# Patient Record
Sex: Female | Born: 1950 | Race: Black or African American | Hispanic: No | Marital: Single | Smoking: Current every day smoker
Health system: Southern US, Community
[De-identification: ages and names within clinical notes are randomized; demographics above are authoritative.]

## PROBLEM LIST (undated history)

## (undated) DIAGNOSIS — E7800 Pure hypercholesterolemia, unspecified: Secondary | ICD-10-CM

## (undated) DIAGNOSIS — I10 Essential (primary) hypertension: Secondary | ICD-10-CM

## (undated) HISTORY — PX: HIP SURGERY: SHX245

## (undated) HISTORY — PX: BREAST CYST INCISION AND DRAINAGE: SHX14

## (undated) HISTORY — DX: Pure hypercholesterolemia, unspecified: E78.00

---

## 1999-01-24 ENCOUNTER — Ambulatory Visit (HOSPITAL_COMMUNITY): Admission: RE | Admit: 1999-01-24 | Discharge: 1999-01-24 | Payer: Self-pay | Admitting: *Deleted

## 1999-05-05 ENCOUNTER — Inpatient Hospital Stay (HOSPITAL_COMMUNITY): Admission: EM | Admit: 1999-05-05 | Discharge: 1999-05-15 | Payer: Self-pay | Admitting: Orthopedic Surgery

## 1999-05-06 ENCOUNTER — Encounter: Payer: Self-pay | Admitting: Orthopedic Surgery

## 1999-05-08 ENCOUNTER — Encounter (INDEPENDENT_AMBULATORY_CARE_PROVIDER_SITE_OTHER): Payer: Self-pay | Admitting: Specialist

## 1999-06-23 ENCOUNTER — Encounter: Payer: Self-pay | Admitting: Orthopedic Surgery

## 1999-06-23 ENCOUNTER — Ambulatory Visit (HOSPITAL_COMMUNITY): Admission: RE | Admit: 1999-06-23 | Discharge: 1999-06-23 | Payer: Self-pay | Admitting: Orthopedic Surgery

## 1999-09-05 ENCOUNTER — Encounter: Payer: Self-pay | Admitting: Orthopedic Surgery

## 1999-09-10 ENCOUNTER — Encounter (INDEPENDENT_AMBULATORY_CARE_PROVIDER_SITE_OTHER): Payer: Self-pay | Admitting: Specialist

## 1999-09-10 ENCOUNTER — Inpatient Hospital Stay (HOSPITAL_COMMUNITY): Admission: RE | Admit: 1999-09-10 | Discharge: 1999-09-16 | Payer: Self-pay | Admitting: Orthopedic Surgery

## 1999-09-10 ENCOUNTER — Encounter: Payer: Self-pay | Admitting: Orthopedic Surgery

## 2001-06-27 ENCOUNTER — Encounter: Payer: Self-pay | Admitting: Family Medicine

## 2001-06-27 ENCOUNTER — Ambulatory Visit (HOSPITAL_COMMUNITY): Admission: RE | Admit: 2001-06-27 | Discharge: 2001-06-27 | Payer: Self-pay | Admitting: Family Medicine

## 2001-07-18 ENCOUNTER — Encounter: Payer: Self-pay | Admitting: Family Medicine

## 2001-07-18 ENCOUNTER — Ambulatory Visit (HOSPITAL_COMMUNITY): Admission: RE | Admit: 2001-07-18 | Discharge: 2001-07-18 | Payer: Self-pay | Admitting: Family Medicine

## 2002-08-28 ENCOUNTER — Other Ambulatory Visit: Admission: RE | Admit: 2002-08-28 | Discharge: 2002-08-28 | Payer: Self-pay | Admitting: Obstetrics and Gynecology

## 2003-04-10 ENCOUNTER — Encounter: Payer: Self-pay | Admitting: Orthopedic Surgery

## 2003-04-17 ENCOUNTER — Encounter: Payer: Self-pay | Admitting: Orthopedic Surgery

## 2003-04-17 ENCOUNTER — Inpatient Hospital Stay (HOSPITAL_COMMUNITY): Admission: RE | Admit: 2003-04-17 | Discharge: 2003-04-22 | Payer: Self-pay | Admitting: Orthopedic Surgery

## 2003-06-29 ENCOUNTER — Encounter: Admission: RE | Admit: 2003-06-29 | Discharge: 2003-06-29 | Payer: Self-pay | Admitting: Internal Medicine

## 2003-06-29 ENCOUNTER — Encounter: Payer: Self-pay | Admitting: Internal Medicine

## 2003-07-03 ENCOUNTER — Encounter: Admission: RE | Admit: 2003-07-03 | Discharge: 2003-07-03 | Payer: Self-pay | Admitting: Internal Medicine

## 2003-07-03 ENCOUNTER — Encounter: Payer: Self-pay | Admitting: Internal Medicine

## 2003-09-12 ENCOUNTER — Encounter (INDEPENDENT_AMBULATORY_CARE_PROVIDER_SITE_OTHER): Payer: Self-pay | Admitting: Specialist

## 2003-09-12 ENCOUNTER — Ambulatory Visit (HOSPITAL_COMMUNITY): Admission: RE | Admit: 2003-09-12 | Discharge: 2003-09-12 | Payer: Self-pay | Admitting: Gastroenterology

## 2004-06-30 ENCOUNTER — Other Ambulatory Visit: Admission: RE | Admit: 2004-06-30 | Discharge: 2004-06-30 | Payer: Self-pay | Admitting: Obstetrics and Gynecology

## 2004-07-03 ENCOUNTER — Encounter: Admission: RE | Admit: 2004-07-03 | Discharge: 2004-07-03 | Payer: Self-pay | Admitting: Internal Medicine

## 2005-07-02 ENCOUNTER — Other Ambulatory Visit: Admission: RE | Admit: 2005-07-02 | Discharge: 2005-07-02 | Payer: Self-pay | Admitting: Obstetrics and Gynecology

## 2005-10-07 ENCOUNTER — Encounter: Admission: RE | Admit: 2005-10-07 | Discharge: 2005-10-07 | Payer: Self-pay | Admitting: Obstetrics and Gynecology

## 2006-07-06 ENCOUNTER — Other Ambulatory Visit: Admission: RE | Admit: 2006-07-06 | Discharge: 2006-07-06 | Payer: Self-pay | Admitting: Obstetrics and Gynecology

## 2006-10-12 ENCOUNTER — Encounter: Admission: RE | Admit: 2006-10-12 | Discharge: 2006-10-12 | Payer: Self-pay | Admitting: Internal Medicine

## 2006-10-18 ENCOUNTER — Encounter: Payer: Self-pay | Admitting: Vascular Surgery

## 2006-10-18 ENCOUNTER — Ambulatory Visit (HOSPITAL_COMMUNITY): Admission: RE | Admit: 2006-10-18 | Discharge: 2006-10-18 | Payer: Self-pay | Admitting: Internal Medicine

## 2006-10-19 ENCOUNTER — Encounter: Admission: RE | Admit: 2006-10-19 | Discharge: 2006-10-19 | Payer: Self-pay | Admitting: Internal Medicine

## 2007-12-07 ENCOUNTER — Encounter: Admission: RE | Admit: 2007-12-07 | Discharge: 2007-12-07 | Payer: Self-pay | Admitting: Obstetrics and Gynecology

## 2008-12-07 ENCOUNTER — Encounter: Admission: RE | Admit: 2008-12-07 | Discharge: 2008-12-07 | Payer: Self-pay | Admitting: Obstetrics and Gynecology

## 2010-01-03 ENCOUNTER — Encounter: Admission: RE | Admit: 2010-01-03 | Discharge: 2010-01-03 | Payer: Self-pay | Admitting: Internal Medicine

## 2011-03-27 NOTE — H&P (Signed)
NAME:  Briana Obrien, Briana Obrien NO.:  [id number]   MEDICAL RECORD NO.:  [id number]                    PATIENT TYPE:   LOCATION:                                       FACILITY:   PHYSICIAN:  Georges Lynch. Gioffre, M.D.             DATE OF BIRTH:  [birth year]   DATE OF ADMISSION:  04/17/2003  DATE OF DISCHARGE:                                HISTORY & PHYSICAL   HISTORY OF PRESENT ILLNESS:  The patient has had left hip pain for the last  three years.  The pain has gotten worse over the past year.  She is  ambulating with a cane.  It is known that she has had arthritis in her hip  for approximately four years.  She underwent a right total hip arthroplasty  in 2000 and she has done very well since that surgery and has elected to  proceed with a left total hip arthroplasty.   DRUG ALLERGIES:  PENICILLIN causes a rash and swelling.   CURRENT MEDICATIONS:  None.   PAST MEDICAL HISTORY:  1. Degenerative arthritis.  2. Migraine headaches.  3. Borderline hypertension.  4. Hiatal hernia.  5. Gastroesophageal reflux disease.   FAMILY HISTORY:  Mother had diabetes and a CVA.  Father had heart disease  and diabetes.   REVIEW OF SYMPTOMS:  GENERAL:  She denies weight change, fevers, chills or  fatigue.  HEENT:  She denies headaches, visual changes, hearing loss or sore  throat.  CARDIOVASCULAR:  She denies chest pain, palpitations, shortness of  breath or orthopnea.  PULMONARY:  She denies dyspnea, wheezing, cough,  sputum production or hemoptysis.  GI:  She denies dysphagia, nausea,  vomiting, hematemesis or abdominal pain.  GU:  She denies dysuria,  frequency, urgency or hematuria.  ENDOCRINE:  She denies polyuria,  polydipsia, appetite change, heat or cold intolerance.  MUSCULOSKELETAL:  The patient has pain in her left hip and painful range of motion.  NEUROLOGIC:  She denies dizziness, vertigo, syncope or seizure.  SKIN:  She  denies itching, rashes, masses or  moles.   PHYSICAL EXAMINATION:  VITAL SIGNS:  Temperature 99.0, pulse 78,  respirations 18, blood pressure 130/80 right arm sitting.  GENERAL:  [age]-year-old female in no acute distress.  HEENT:  Pupils equal, round and reactive to light.  Extraocular movements  are intact.  Pharynx is clear.  TM's are intact.  The neck is supple without  masses.  No carotid bruits are detected.  CHEST:  Clear to auscultation bilaterally.  No wheezing, rales or rhonchi  noted.  HEART:  Regular rate and rhythm without murmurs, rubs or gallops.  ABDOMEN:  Positive bowel sounds, soft and non-tender.  No organomegaly or  abnormal masses.  EXTREMITIES:  Left hip - the patient has painful range of motion.  SKIN:  Warm and dry.   X-ray of the left hip shows complete collapse  of the femoral head and a  large crescent sign.    IMPRESSION:  Aseptic necrosis of the left hip.   PLAN:  The patient is to be admitted to [facility] on April 17, 2003  to undergo left total hip arthroplasty.     Ebbie Ridge. Paitsel, P.A.                     Ronald A. Darrelyn Hillock, M.D.    Tilden Dome  D:  04/10/2003  T:  04/10/2003  Job:  [id number]

## 2011-03-27 NOTE — Op Note (Signed)
   NAMESTINA, GANE                             ACCOUNT NO.:  [id number]   MEDICAL RECORD NO.:  [id number]                   PATIENT TYPE:  AMB   LOCATION:  ENDO                                 FACILITY:  [facility]   PHYSICIAN:  Petra Kuba, M.D.                 DATE OF BIRTH:  Nov 01, 1951   DATE OF PROCEDURE:  09/12/2003  DATE OF DISCHARGE:                                 OPERATIVE REPORT   PROCEDURE:  Colonoscopy.   INDICATIONS FOR PROCEDURE:  Multiple GI complaints, family history of colon  polyps, due for colonic screening.   Consent was signed after risks, benefits, methods, and options were  thoroughly discussed in the office.   MEDICINES USED:  Demerol 75, Versed 7.   DESCRIPTION OF PROCEDURE:  Rectal inspection was pertinent for small  external hemorrhoids. Digital exam was negative. The pediatric video  adjustable colonoscope was inserted and with abdominal pressure easily  advanced around the colon to the cecum. No position changes were needed. No  abnormalities were seen on insertion. The scope was inserted a short ways  into the terminal ileum which was normal. The scope was slowly withdrawn.  The prep was adequate. There was some liquid stool that required washing and  suctioning. On slow withdrawal through the colon, no abnormalities were seen  specifically no polyps, tumors, masses or diverticula. Anal rectal pull  through and retroflexion confirmed the hemorrhoids back in the rectum. The  scope was reinserted a short ways into the left side of the colon, air was  suctioned, scope removed. The patient tolerated the procedure well. There  was no obvious or immediate complications.   ENDOSCOPIC DIAGNOSIS:  1. Internal and external hemorrhoids.  2. Otherwise within normal limits to the end of the terminal ileum.   PLAN:  Continue workup with an EGD, rescreen in 5-10 years and yearly  rectals and guaiacs Dr. __________. Please seen endoscopy for further workup  plans  and recommendations.                                               Petra Kuba, M.D.    MEM/MEDQ  D:  09/12/2003  T:  09/12/2003  Job:  [id number]   cc:   Dr. Rozell Searing (?)

## 2011-03-27 NOTE — Op Note (Signed)
   Briana Obrien, Briana Obrien                             ACCOUNT NO.:  [id number]   MEDICAL RECORD NO.:  [id number]                   PATIENT TYPE:  AMB   LOCATION:  ENDO                                 FACILITY:  [facility]   PHYSICIAN:  Petra Kuba, M.D.                 DATE OF BIRTH:  [birth year]   DATE OF PROCEDURE:  09/12/2003  DATE OF DISCHARGE:                                 OPERATIVE REPORT   PROCEDURE:  Esophagogastroduodenoscopy with biopsy.   INDICATIONS FOR PROCEDURE:  Upper tract symptoms long standing. Consent was  signed after risks, benefits, methods, and options were thoroughly discussed  in the office.   MEDICATIONS:  No additional medicines were used for this procedure since it  followed the colonoscopy procedure.   DESCRIPTION OF PROCEDURE:  The video endoscope was inserted by direct  vision. There was a tiny hiatal hernia at the GE junction and very minimal  distal esophagitis but no other abnormalities. The scope passed into the  stomach, advanced through the antrum pertinent for some mild antritis  through a normal pylorus into a normal duodenal bulb and around the C loop  to a normal second portion of the duodenum. The scope was withdrawn back to  the bulb and a good look there ruled out ulcers in that location. The scope  was withdrawn back to the stomach and retroflexed. The angularis was normal  high in the cardia, the hiatal hernia was confirmed; it was tiny.  The  fundus, lesser and greater curve were evaluated on retroflexed visualization  which confirmed some minimal gastritis. Straight visualization of the  stomach confirmed some mild to moderate gastritis throughout but no  additional findings. Biopsies of all areas of the stomach were obtained to  confirm the gastritis, rule out helicobacter. Air was suctioned, the scope  slowly withdrawn. Again a good look at the esophagus confirmed the above  finding. The scope was removed. The patient tolerated the  procedure well.  There was no obvious or immediate complications.   ENDOSCOPIC DIAGNOSES:  1. Tiny hiatal hernia with minimal distal esophagitis.  2. Mild to moderate gastritis, antritis throughout status post biopsy.  3. Otherwise normal esophagogastroduodenoscopy.   PLAN:  Await pathology, continue Prevacid, followup in 2 months to recheck  symptoms and make sure no further workup plans are needed. Happy to see back  sooner p.r.n.                                               Petra Kuba, M.D.    MEM/MEDQ  D:  09/12/2003  T:  09/12/2003  Job:  [id number]   cc:   Dr. ____________________

## 2011-03-27 NOTE — Discharge Summary (Signed)
Briana Obrien, Briana Obrien                             ACCOUNT NO.:  [id number]   MEDICAL RECORD NO.:  [id number]                   PATIENT TYPE:  INP   LOCATION:  0484                                 FACILITY:  [facility]   PHYSICIAN:  Georges Lynch. Darrelyn Hillock, M.D.             DATE OF BIRTH:  Mar 18, 1951   DATE OF ADMISSION:  04/17/2003  DATE OF DISCHARGE:  04/22/2003                                 DISCHARGE SUMMARY   ADMISSION DIAGNOSES:  1. Degenerative arthritis left hip.  2. Migraine headaches.  3. Borderline hypertension.  4. Hiatal hernia.  5. Gastroesophageal reflux disease.   DISCHARGE DIAGNOSES:  1. Degenerative arthritis left hip, status post left total knee     arthroplasty.  2. Migraine headaches.  3. Borderline hypertension.  4. Hiatal hernia.  5. Gastroesophageal reflux disease.   PROCEDURE:  The patient was taken to the operating room on April 17, 2003, to  undergo left total hip arthroplasty.  Surgeon was Windy Fast A. Darrelyn Hillock, M.D.,  assistant was Ronnell Guadalajara, M.D.  The surgery was performed under general  anesthesia.   CONSULTATIONS:  Physical therapy and occupational therapy.   BRIEF HISTORY:  This patient is a 60 year old female who has had left hip  pain for the past three years.  The pain has steadily gotten worse over the  past year.  She is having difficulty ambulating and requires a cane for  support.  The patient has known that she has had arthritis in her hip for  approximately four years.  She has undergone a right total knee arthroplasty  in 2000 and she has done very well since that time and has elected to  proceed with a left total hip arthroplasty and the patient was admitted to  the hospital for the same.   LABORATORY DATA:  Preadmission CBC with wbc 4.4, hemoglobin 12.1, hematocrit  36.7, platelet count 294.  Preadmission PT 11.8, INR 0.8, PTT 27.  Routine  preadmission chemistry was normal.  Preadmission urinalysis was negative.  The patient's blood type  is A negative, negative antibody screen.   Preadmission EKG with normal sinus rhythm at a rate of 65.   Preadmission chest x-ray with no active disease.  Preadmission x-ray of the  left hip revealed advanced avascular necrosis of the left femoral head.  Postoperative x-ray of the left hip revealed presence of hip prosthesis in  good alignment.   HOSPITAL COURSE:  The patient was admitted to [facility] and taken  to the operating room to undergo a left total hip arthroplasty.  The patient  tolerated the procedure well and was allowed to return to the recovery room  and then to the orthopedic floor to continue her postoperative care.  The  patient progressed well.  Her hemoglobin and hematocrits were followed  throughout her hospitalization.  She had a decrease in her hemoglobin on  postoperative day #4 requiring a  blood transfusion.  Her hemoglobin  stabilized prior to discharge.  Hemoglobin at the time of transfusion was  7.9.  Her hemoglobin stabilized at 11.4 following her blood transfusion.  The patient continued to progress.  Physical therapy was consulted for gait  training ambulation when the patient was able to walk greater than 100 feet  by postoperative day #5.  She was discharged home.   DISPOSITION:  The patient was discharged home on April 22, 2003.   DISCHARGE MEDICATIONS:  1. Trinsicon one tablet three times daily.  2. Coumadin 6 mg daily.  3. Percocet 10/650 one or two every six hours as needed for pain.  4. Robaxin 500 mg one every six hours as needed for muscle spasms.   DIET:  As tolerated.   ACTIVITY:  Total hip precautions.  [facility] for home care.  Touchdown  weightbearing to left lower extremity with walker.   FOLLOW UP:  The patient is to follow up with Dr. Darrelyn Hillock two weeks from the  date of surgery.  She should call the office to schedule an appointment.   CONDITION ON DISCHARGE:  Improved.     Ebbie Ridge. Paitsel, P.A.                     Ronald  A. Darrelyn Hillock, M.D.    Tilden Dome  D:  05/07/2003  T:  05/07/2003  Job:  [id number]

## 2011-03-27 NOTE — Op Note (Signed)
Briana Obrien, Briana Obrien                             ACCOUNT NO.:  [id number]   MEDICAL RECORD NO.:  [id number]                   PATIENT TYPE:  INP   LOCATION:  0002                                 FACILITY:  [facility]   PHYSICIAN:  Georges Lynch. Darrelyn Hillock, M.D.             DATE OF BIRTH:  [birth year]   DATE OF PROCEDURE:  04/17/2003  DATE OF DISCHARGE:                                 OPERATIVE REPORT   SURGEON:  Georges Lynch. Darrelyn Hillock, M.D.   ASSISTANT:  Ronnell Guadalajara, M.D.   PREOPERATIVE DIAGNOSIS:  Avascular necrosis, left hip.   POSTOPERATIVE DIAGNOSIS:  Avascular necrosis, left hip.   OPERATION:  Left total hip arthroplasty utilizing the Osteonix system. I  utilized the ceramic prosthesis, the Trident cup, size 48 mm was used with 2  screws for fixation. We used the ceramic insert. The ceramic head was a size  28 mm diameter, -2.5 mm in length. The femoral component was a 5-10 secure  fit femoral component. The ceramic insert was a cup with a 28-mm diameter.   DESCRIPTION OF PROCEDURE:  Under general anesthesia routine orthopedic  prepping and draping of the left hip was carried out. With the patient on  the right side with the left hip up, a posterolateral approach to the hip  was carried out. Bleeders were identified  and cauterized. She had 1 gm of  IV Ancef preoperatively.   Following that the self-retaining retractors were inserted. I then carried  the incision down to the iliotibial band. I excised the greater trochanteric  bursa. I then detached the external rotators with great care taken not to  injure the sciatic nerve. I then did a complete capsulectomy and then  dislocated the head and amputated the head at the appropriate neck length.  Following this I inserted my guide pin and carried out my reaming and  rasping up to a size 10 secure fit. The distal reaming was carried out to  the appropriate diameter for the size 10 prosthesis.   After the femur was prepared we then went on  and reamed the acetabulum up to  a size 46 with a 46 reamer and we elected to use a 48-mm cup. Note we did go  through the appropriate trials prior to inserting the permanent cup. Once we  selected the appropriate position of the cup, we then  inserted our  permanent Trident cup. I utilized the  bur to bur down the medial wall of  the acetabulum in order to get a secure fit for the cup. We then went  through trials once again, and then once the trials were completed, we then  inserted our permanent 28-mm diameter ceramic insert. Prior to doing this we  utilized 2 screws for fixation purposes.   After this we then inserted our permanent size 10 secure fit femoral stem.  We then went through trials as far as our  C-tapered head again and selected  a -2.5 ceramic head 28 mm in diameter. We inserted that over the femoral  stem, reduced the hip and had excellent function and excellent stability.  Note we could not go any longer than a -2.5-mm C-tapered head with a 28-mm  in diameter.   We then irrigated the wound and closed the wound in layers in the usual  fashion. Sterile dressings were applied. The patient was placed in the knee  immobilizer and left the operating room in satisfactory condition.                                               Ronald A. Darrelyn Hillock, M.D.    RAG/MEDQ  D:  04/17/2003  T:  04/17/2003  Job:  [id number]

## 2011-04-27 ENCOUNTER — Other Ambulatory Visit: Payer: Self-pay | Admitting: Internal Medicine

## 2011-04-27 DIAGNOSIS — Z1231 Encounter for screening mammogram for malignant neoplasm of breast: Secondary | ICD-10-CM

## 2011-05-06 ENCOUNTER — Ambulatory Visit
Admission: RE | Admit: 2011-05-06 | Discharge: 2011-05-06 | Disposition: A | Discharge status: Home / Self Care | Payer: Medicaid Other | Source: Ambulatory Visit | Attending: Internal Medicine | Admitting: Internal Medicine

## 2011-05-06 DIAGNOSIS — Z1231 Encounter for screening mammogram for malignant neoplasm of breast: Secondary | ICD-10-CM

## 2013-11-15 ENCOUNTER — Encounter (HOSPITAL_COMMUNITY): Payer: Self-pay | Admitting: Emergency Medicine

## 2013-11-15 ENCOUNTER — Emergency Department (HOSPITAL_COMMUNITY)
Admission: EM | Admit: 2013-11-15 | Discharge: 2013-11-15 | Disposition: A | Discharge status: Home / Self Care | Payer: Medicare Other | Source: Home / Self Care | Attending: Family Medicine | Admitting: Family Medicine

## 2013-11-15 DIAGNOSIS — N390 Urinary tract infection, site not specified: Secondary | ICD-10-CM

## 2013-11-15 DIAGNOSIS — R319 Hematuria, unspecified: Secondary | ICD-10-CM

## 2013-11-15 LAB — POCT URINALYSIS DIP (DEVICE)
Bilirubin Urine: NEGATIVE
GLUCOSE, UA: NEGATIVE mg/dL
Ketones, ur: NEGATIVE mg/dL
NITRITE: NEGATIVE
SPECIFIC GRAVITY, URINE: 1.02 (ref 1.005–1.030)
UROBILINOGEN UA: 0.2 mg/dL (ref 0.0–1.0)
pH: 6.5 (ref 5.0–8.0)

## 2013-11-15 MED ORDER — CIPROFLOXACIN HCL 500 MG PO TABS: 500.0000 mg | ORAL_TABLET | Freq: Two times a day (BID) | ORAL | Status: DC

## 2013-11-15 NOTE — ED Provider Notes (Signed)
Briana Obrien is a 63 y.o. female who presents to Urgent Care today for dysuria urgency frequency and hematuria starting yesterday. No back pain or abdominal pain. No nausea vomiting diarrhea. No fevers or chills. Patient has used cranberry juice which helps some. She feels well otherwise.   History reviewed. No pertinent past medical history. History  Substance Use Topics  . Smoking status: Current Every Day Smoker  . Smokeless tobacco: Not on file  . Alcohol Use: No   ROS as above Medications reviewed. No current facility-administered medications for this encounter.   Current Outpatient Prescriptions  Medication Sig Dispense Refill  . risperiDONE (RISPERDAL) 3 MG tablet Take 3 mg by mouth at bedtime.      . simvastatin (ZOCOR) 20 MG tablet Take 20 mg by mouth daily.      . ciprofloxacin (CIPRO) 500 MG tablet Take 1 tablet (500 mg total) by mouth 2 (two) times daily.  14 tablet  0    Exam:  BP 118/80  Pulse 102  Temp(Src) 98.6 F (37 C) (Oral)  Resp 18  SpO2 96% Gen: Well NAD HEENT:   MMM Lungs: Normal work of breathing. CTABL Heart: RRR no MRG Abd: NABS, Soft. NT, ND no CV angle tenderness to percussion  Exts:  warm and well perfused.   Results for orders placed during the hospital encounter of 11/15/13 (from the past 24 hour(s))  POCT URINALYSIS DIP (DEVICE)     Status: Abnormal   Collection Time    11/15/13  9:30 AM      Result Value Range   Glucose, UA NEGATIVE  NEGATIVE mg/dL   Bilirubin Urine NEGATIVE  NEGATIVE   Ketones, ur NEGATIVE  NEGATIVE mg/dL   Specific Gravity, Urine 1.020  1.005 - 1.030   Hgb urine dipstick LARGE (*) NEGATIVE   pH 6.5  5.0 - 8.0   Protein, ur >=300 (*) NEGATIVE mg/dL   Urobilinogen, UA 0.2  0.0 - 1.0 mg/dL   Nitrite NEGATIVE  NEGATIVE   Leukocytes, UA LARGE (*) NEGATIVE   No results found.  Assessment and Plan: 63 y.o. female with urinary tract infection. Plan to treat with ciprofloxacin as patient is allergic to penicillin. Urine  culture pending. Followup with primary care provider. Recommend AZO for temporary symptom control while antibiotics are working.    Discussed warning signs or symptoms. Please see discharge instructions. Patient expresses understanding.    Gregor Hams, MD 11/15/13 1002

## 2013-11-15 NOTE — ED Notes (Signed)
Blood in UA since yesterday

## 2013-11-15 NOTE — Discharge Instructions (Signed)
Thank you for coming in today. Take Cipro twice daily for one week. Use over-the-counter AZO pills for pain control temporarily. Followup with your primary care doctor. If your belly pain worsens, or you have high fever, bad vomiting, blood in your stool or black tarry stool go to the Emergency Room.

## 2013-11-17 LAB — URINE CULTURE

## 2013-11-17 NOTE — ED Notes (Signed)
Urine culture: 20,000 colonies E. Coli.  Pt. adequately treated with Cipro. Roselyn Meier 11/17/2013

## 2014-04-03 ENCOUNTER — Other Ambulatory Visit: Payer: Self-pay

## 2014-04-03 DIAGNOSIS — Z1231 Encounter for screening mammogram for malignant neoplasm of breast: Secondary | ICD-10-CM

## 2014-04-13 ENCOUNTER — Ambulatory Visit
Admission: RE | Admit: 2014-04-13 | Discharge: 2014-04-13 | Disposition: A | Discharge status: Home / Self Care | Payer: Medicare Other | Source: Ambulatory Visit

## 2014-04-13 ENCOUNTER — Encounter (INDEPENDENT_AMBULATORY_CARE_PROVIDER_SITE_OTHER): Payer: Self-pay

## 2014-04-13 DIAGNOSIS — Z1231 Encounter for screening mammogram for malignant neoplasm of breast: Secondary | ICD-10-CM

## 2014-04-16 ENCOUNTER — Other Ambulatory Visit: Payer: Self-pay | Admitting: Internal Medicine

## 2014-04-16 DIAGNOSIS — R928 Other abnormal and inconclusive findings on diagnostic imaging of breast: Secondary | ICD-10-CM

## 2014-04-20 ENCOUNTER — Ambulatory Visit
Admission: RE | Admit: 2014-04-20 | Discharge: 2014-04-20 | Disposition: A | Discharge status: Home / Self Care | Payer: Medicare Other | Source: Ambulatory Visit | Attending: Internal Medicine | Admitting: Internal Medicine

## 2014-04-20 ENCOUNTER — Other Ambulatory Visit: Payer: Self-pay | Admitting: Internal Medicine

## 2014-04-20 DIAGNOSIS — R928 Other abnormal and inconclusive findings on diagnostic imaging of breast: Secondary | ICD-10-CM

## 2014-05-25 ENCOUNTER — Other Ambulatory Visit: Payer: Self-pay | Admitting: Gastroenterology

## 2015-04-05 ENCOUNTER — Other Ambulatory Visit: Payer: Self-pay

## 2015-04-05 DIAGNOSIS — Z1231 Encounter for screening mammogram for malignant neoplasm of breast: Secondary | ICD-10-CM

## 2015-04-17 ENCOUNTER — Ambulatory Visit
Admission: RE | Admit: 2015-04-17 | Discharge: 2015-04-17 | Disposition: A | Discharge status: Home / Self Care | Payer: Medicare Other | Source: Ambulatory Visit

## 2015-04-17 DIAGNOSIS — Z1231 Encounter for screening mammogram for malignant neoplasm of breast: Secondary | ICD-10-CM

## 2016-04-16 ENCOUNTER — Other Ambulatory Visit (HOSPITAL_COMMUNITY): Payer: Self-pay | Admitting: Diagnostic Radiology

## 2016-04-17 ENCOUNTER — Other Ambulatory Visit: Payer: Self-pay | Admitting: Internal Medicine

## 2016-04-17 DIAGNOSIS — Z1231 Encounter for screening mammogram for malignant neoplasm of breast: Secondary | ICD-10-CM

## 2016-04-18 ENCOUNTER — Other Ambulatory Visit: Payer: Self-pay | Admitting: Internal Medicine

## 2016-04-18 DIAGNOSIS — E2839 Other primary ovarian failure: Secondary | ICD-10-CM

## 2016-05-25 ENCOUNTER — Ambulatory Visit: Payer: Medicare Other

## 2016-05-25 ENCOUNTER — Other Ambulatory Visit: Payer: Medicare Other

## 2016-09-03 ENCOUNTER — Other Ambulatory Visit: Payer: Medicare Other

## 2016-09-03 ENCOUNTER — Ambulatory Visit: Payer: Medicare Other

## 2016-09-15 ENCOUNTER — Ambulatory Visit
Admission: RE | Admit: 2016-09-15 | Discharge: 2016-09-15 | Disposition: A | Discharge status: Home / Self Care | Payer: Medicare Other | Source: Ambulatory Visit | Attending: Internal Medicine | Admitting: Internal Medicine

## 2016-09-15 DIAGNOSIS — E2839 Other primary ovarian failure: Secondary | ICD-10-CM

## 2016-09-15 DIAGNOSIS — Z1231 Encounter for screening mammogram for malignant neoplasm of breast: Secondary | ICD-10-CM

## 2019-05-27 ENCOUNTER — Other Ambulatory Visit: Payer: Self-pay | Admitting: Internal Medicine

## 2019-05-27 DIAGNOSIS — E2839 Other primary ovarian failure: Secondary | ICD-10-CM

## 2019-06-13 ENCOUNTER — Other Ambulatory Visit: Payer: Self-pay | Admitting: Internal Medicine

## 2019-06-13 DIAGNOSIS — Z1231 Encounter for screening mammogram for malignant neoplasm of breast: Secondary | ICD-10-CM

## 2019-08-23 ENCOUNTER — Ambulatory Visit
Admission: RE | Admit: 2019-08-23 | Discharge: 2019-08-23 | Disposition: A | Discharge status: Home / Self Care | Payer: Medicare Other | Source: Ambulatory Visit | Attending: Internal Medicine | Admitting: Internal Medicine

## 2019-08-23 ENCOUNTER — Other Ambulatory Visit: Payer: Self-pay

## 2019-08-23 DIAGNOSIS — Z1231 Encounter for screening mammogram for malignant neoplasm of breast: Secondary | ICD-10-CM

## 2019-08-23 DIAGNOSIS — E2839 Other primary ovarian failure: Secondary | ICD-10-CM

## 2020-01-11 ENCOUNTER — Ambulatory Visit: Payer: Medicare Other | Attending: Internal Medicine

## 2020-01-11 DIAGNOSIS — Z23 Encounter for immunization: Secondary | ICD-10-CM | POA: Insufficient documentation

## 2020-01-11 NOTE — Progress Notes (Signed)
   Covid-19 Vaccination Clinic  Name:  Briana Obrien    MRN: [id number] DOB: 02-20-1951  01/11/2020  Briana Obrien was observed post Covid-19 immunization for 15 minutes without incident. She was provided with Vaccine Information Sheet and instruction to access the V-Safe system.   Briana Obrien was instructed to call 911 with any severe reactions post vaccine: Marland Kitchen Difficulty breathing  . Swelling of face and throat  . A fast heartbeat  . A bad rash all over body  . Dizziness and weakness   Immunizations Administered    Name Date Dose VIS Date Route   Pfizer COVID-19 Vaccine 01/11/2020  3:46 PM 0.3 mL 10/20/2019 Intramuscular   Manufacturer: [place]   Lot: [id number]   [place]: [id number]

## 2020-02-07 ENCOUNTER — Ambulatory Visit: Payer: Medicare Other | Attending: Internal Medicine

## 2020-02-07 DIAGNOSIS — Z23 Encounter for immunization: Secondary | ICD-10-CM

## 2020-02-07 NOTE — Progress Notes (Signed)
   Covid-19 Vaccination Clinic  Name:  [name]    MRN: [id number] DOB: 1951-01-30  02/07/2020  [name] was observed post Covid-19 immunization for 30 minutes based on pre-vaccination screening without incident. She was provided with Vaccine Information Sheet and instruction to access the V-Safe system.   [name] was instructed to call 911 with any severe reactions post vaccine: Marland Kitchen Difficulty breathing  . Swelling of face and throat  . A fast heartbeat  . A bad rash all over body  . Dizziness and weakness   Immunizations Administered    Name Date Dose VIS Date Route   Pfizer COVID-19 Vaccine 02/07/2020 10:18 AM 0.3 mL 10/20/2019 Intramuscular   Manufacturer: [employer], [employer]   Lot: [id number]   [place]: [id number]

## 2020-09-25 ENCOUNTER — Encounter: Payer: Self-pay | Admitting: Obstetrics

## 2020-09-25 ENCOUNTER — Ambulatory Visit (INDEPENDENT_AMBULATORY_CARE_PROVIDER_SITE_OTHER): Payer: Medicare Other | Admitting: Obstetrics

## 2020-09-25 ENCOUNTER — Other Ambulatory Visit: Payer: Self-pay

## 2020-09-25 ENCOUNTER — Other Ambulatory Visit (HOSPITAL_COMMUNITY)
Admission: RE | Admit: 2020-09-25 | Discharge: 2020-09-25 | Disposition: A | Discharge status: Home / Self Care | Payer: Medicare Other | Source: Ambulatory Visit | Attending: Obstetrics | Admitting: Obstetrics

## 2020-09-25 VITALS — BP 144/85 | HR 68 | Ht 62.0 in | Wt 150.0 lb

## 2020-09-25 DIAGNOSIS — N898 Other specified noninflammatory disorders of vagina: Secondary | ICD-10-CM

## 2020-09-25 DIAGNOSIS — Z01419 Encounter for gynecological examination (general) (routine) without abnormal findings: Secondary | ICD-10-CM | POA: Diagnosis not present

## 2020-09-25 DIAGNOSIS — Z1151 Encounter for screening for human papillomavirus (HPV): Secondary | ICD-10-CM | POA: Insufficient documentation

## 2020-09-25 DIAGNOSIS — Z113 Encounter for screening for infections with a predominantly sexual mode of transmission: Secondary | ICD-10-CM

## 2020-09-25 NOTE — Progress Notes (Signed)
Pt presents for annual, pap, and all STD testing.  Last mammogram 08/22/2020 Last Bone Density 08/22/2020 Colonoscopy due in 5 yrs per pt

## 2020-09-25 NOTE — Progress Notes (Signed)
Subjective:        Briana Obrien is a 69 y.o. female here for a routine exam.  Current complaints: Vaginal discharge.    Personal health questionnaire:  Is patient Ashkenazi Jewish, have a family history of breast and/or ovarian cancer: no Is there a family history of uterine cancer diagnosed at age < 42, gastrointestinal cancer, urinary tract cancer, family member who is a Field seismologist syndrome-associated carrier: no Is the patient overweight and hypertensive, family history of diabetes, personal history of gestational diabetes, preeclampsia or PCOS: no Is patient over 60, have PCOS,  family history of premature CHD under age 55, diabetes, smoke, have hypertension or peripheral artery disease:  no At any time, has a partner hit, kicked or otherwise hurt or frightened you?: no Over the past 2 weeks, have you felt down, depressed or hopeless?: no Over the past 2 weeks, have you felt little interest or pleasure in doing things?:no   Gynecologic History No LMP recorded (lmp unknown). Patient is postmenopausal. Contraception: post menopausal status Last Pap: unknown. Results were: normal Last mammogram: 08-22-2020. Results were: normal  Obstetric History OB History  Gravida Para Term Preterm AB Living  4       1 3   SAB TAB Ectopic Multiple Live Births    1     3    # Outcome Date GA Lbr Len/2nd Weight Sex Delivery Anes PTL Lv  4 Gravida           3 Gravida           2 Gravida           1 TAB             Past Medical History:  Diagnosis Date  . Hypercholesteremia     Past Surgical History:  Procedure Laterality Date  . BREAST CYST INCISION AND DRAINAGE    . HIP SURGERY Bilateral      Current Outpatient Medications:  .  risperiDONE (RISPERDAL) 3 MG tablet, Take 3 mg by mouth at bedtime., Disp: , Rfl:  .  simvastatin (ZOCOR) 20 MG tablet, Take 20 mg by mouth daily., Disp: , Rfl:  .  ciprofloxacin (CIPRO) 500 MG tablet, Take 1 tablet (500 mg total) by mouth 2 (two) times daily.  (Patient not taking: Reported on 09/25/2020), Disp: 14 tablet, Rfl: 0 .  Ergocalciferol (VITAMIN D2 PO), Vitamin D2, Disp: , Rfl:  .  simvastatin (ZOCOR) 20 MG tablet, simvastatin 20 mg tablet  Take 1 tablet every day by oral route., Disp: , Rfl:  Allergies  Allergen Reactions  . Latex Itching  . Penicillins Swelling  . Plavix [Clopidogrel] Itching    Social History   Tobacco Use  . Smoking status: Current Every Day Smoker    Packs/day: 4.00    Types: Cigarettes  . Smokeless tobacco: Never Used  Substance Use Topics  . Alcohol use: No    Family History  Problem Relation Age of Onset  . Breast cancer Sister       Review of Systems  Constitutional: negative for fatigue and weight loss Respiratory: negative for cough and wheezing Cardiovascular: negative for chest pain, fatigue and palpitations Gastrointestinal: negative for abdominal pain and change in bowel habits Musculoskeletal:negative for myalgias Neurological: negative for gait problems and tremors Behavioral/Psych: negative for abusive relationship, depression Endocrine: negative for temperature intolerance    Genitourinary:negative for abnormal menstrual periods, genital lesions, hot flashes, sexual problems.  Positive for vaginal discharge Integument/breast: negative for breast lump, breast  tenderness, nipple discharge and skin lesion(s)    Objective:       BP (!) 144/85   Pulse 68   Ht 5\' 2"  (1.575 m)   Wt 150 lb (68 kg)   LMP  (LMP Unknown)   BMI 27.44 kg/m  General:   alert and no distress  Skin:   no rash or abnormalities  Lungs:   clear to auscultation bilaterally  Heart:   regular rate and rhythm, S1, S2 normal, no murmur, click, rub or gallop  Breasts:   normal without suspicious masses, skin or nipple changes or axillary nodes  Abdomen:  normal findings: no organomegaly, soft, non-tender and no hernia  Pelvis:  External genitalia: normal general appearance Urinary system: urethral meatus normal  and bladder without fullness, nontender Vaginal: normal without tenderness, induration or masses Cervix: normal appearance Adnexa: normal bimanual exam Uterus: anteverted and non-tender, normal size   Lab Review Urine pregnancy test Labs reviewed yes Radiologic studies reviewed yes  50% of 20 min visit spent on counseling and coordination of care.   Assessment:     1. Encounter for routine gynecological examination with Papanicolaou smear of cervix Rx: - Cytology - PAP  2. Vaginal discharge Rx: - Cervicovaginal ancillary only( [facility])  3. Screen for STD (sexually transmitted disease) Rx: - Hepatitis B surface antigen - Hepatitis C antibody - HIV Antibody (routine testing w rflx) - RPR    Plan:    Education reviewed: calcium supplements, depression evaluation, low fat, low cholesterol diet, safe sex/STD prevention, self breast exams, smoking cessation and weight bearing exercise. Contraception: post menopausal status. Follow up in: 1 year.    Orders Placed This Encounter  Procedures  . Hepatitis B surface antigen  . Hepatitis C antibody  . HIV Antibody (routine testing w rflx)  . RPR   Need to obtain previous records  Shelly Bombard, MD 09/25/2020 10:20 AM

## 2020-09-25 NOTE — Patient Instructions (Signed)
Health Maintenance for Postmenopausal Women Menopause is a normal process in which your ability to get pregnant comes to an end. This process happens slowly over many months or years, usually between the ages of 48 and 55. Menopause is complete when you have missed your menstrual periods for 12 months. It is important to talk with your health care provider about some of the most common conditions that affect women after menopause (postmenopausal women). These include heart disease, cancer, and bone loss (osteoporosis). Adopting a healthy lifestyle and getting preventive care can help to promote your health and wellness. The actions you take can also lower your chances of developing some of these common conditions. What should I know about menopause? During menopause, you may get a number of symptoms, such as:  Hot flashes. These can be moderate or severe.  Night sweats.  Decrease in sex drive.  Mood swings.  Headaches.  Tiredness.  Irritability.  Memory problems.  Insomnia. Choosing to treat or not to treat these symptoms is a decision that you make with your health care provider. Do I need hormone replacement therapy?  Hormone replacement therapy is effective in treating symptoms that are caused by menopause, such as hot flashes and night sweats.  Hormone replacement carries certain risks, especially as you become older. If you are thinking about using estrogen or estrogen with progestin, discuss the benefits and risks with your health care provider. What is my risk for heart disease and stroke? The risk of heart disease, heart attack, and stroke increases as you age. One of the causes may be a change in the body's hormones during menopause. This can affect how your body uses dietary fats, triglycerides, and cholesterol. Heart attack and stroke are medical emergencies. There are many things that you can do to help prevent heart disease and stroke. Watch your blood pressure  High  blood pressure causes heart disease and increases the risk of stroke. This is more likely to develop in people who have high blood pressure readings, are of African descent, or are overweight.  Have your blood pressure checked: ? Every 3-5 years if you are 18-39 years of age. ? Every year if you are 40 years old or older. Eat a healthy diet   Eat a diet that includes plenty of vegetables, fruits, low-fat dairy products, and lean protein.  Do not eat a lot of foods that are high in solid fats, added sugars, or sodium. Get regular exercise Get regular exercise. This is one of the most important things you can do for your health. Most adults should:  Try to exercise for at least 150 minutes each week. The exercise should increase your heart rate and make you sweat (moderate-intensity exercise).  Try to do strengthening exercises at least twice each week. Do these in addition to the moderate-intensity exercise.  Spend less time sitting. Even light physical activity can be beneficial. Other tips  Work with your health care provider to achieve or maintain a healthy weight.  Do not use any products that contain nicotine or tobacco, such as cigarettes, e-cigarettes, and chewing tobacco. If you need help quitting, ask your health care provider.  Know your numbers. Ask your health care provider to check your cholesterol and your blood sugar (glucose). Continue to have your blood tested as directed by your health care provider. Do I need screening for cancer? Depending on your health history and family history, you may need to have cancer screening at different stages of your life. This   may include screening for:  Breast cancer.  Cervical cancer.  Lung cancer.  Colorectal cancer. What is my risk for osteoporosis? After menopause, you may be at increased risk for osteoporosis. Osteoporosis is a condition in which bone destruction happens more quickly than new bone creation. To help prevent  osteoporosis or the bone fractures that can happen because of osteoporosis, you may take the following actions:  If you are 33-49 years old, get at least 1,000 mg of calcium and at least 600 mg of vitamin D per day.  If you are older than age 39 but younger than age 37, get at least 1,200 mg of calcium and at least 600 mg of vitamin D per day.  If you are older than age 47, get at least 1,200 mg of calcium and at least 800 mg of vitamin D per day. Smoking and drinking excessive alcohol increase the risk of osteoporosis. Eat foods that are rich in calcium and vitamin D, and do weight-bearing exercises several times each week as directed by your health care provider. How does menopause affect my mental health? Depression may occur at any age, but it is more common as you become older. Common symptoms of depression include:  Low or sad mood.  Changes in sleep patterns.  Changes in appetite or eating patterns.  Feeling an overall lack of motivation or enjoyment of activities that you previously enjoyed.  Frequent crying spells. Talk with your health care provider if you think that you are experiencing depression. General instructions See your health care provider for regular wellness exams and vaccines. This may include:  Scheduling regular health, dental, and eye exams.  Getting and maintaining your vaccines. These include: ? Influenza vaccine. Get this vaccine each year before the flu season begins. ? Pneumonia vaccine. ? Shingles vaccine. ? Tetanus, diphtheria, and pertussis (Tdap) booster vaccine. Your health care provider may also recommend other immunizations. Tell your health care provider if you have ever been abused or do not feel safe at home. Summary  Menopause is a normal process in which your ability to get pregnant comes to an end.  This condition causes hot flashes, night sweats, decreased interest in sex, mood swings, headaches, or lack of sleep.  Treatment for this  condition may include hormone replacement therapy.  Take actions to keep yourself healthy, including exercising regularly, eating a healthy diet, watching your weight, and checking your blood pressure and blood sugar levels.  Get screened for cancer and depression. Make sure that you are up to date with all your vaccines. This information is not intended to replace advice given to you by your health care provider. Make sure you discuss any questions you have with your health care provider. Document Revised: 10/19/2018 Document Reviewed: 10/19/2018 Elsevier Patient Education  [place].  Constipation, Adult Constipation is when a person:  Poops (has a bowel movement) fewer times in a week than normal.  Has a hard time pooping.  Has poop that is dry, hard, or bigger than normal. Follow these instructions at home: Eating and drinking   Eat foods that have a lot of fiber, such as: ? Fresh fruits and vegetables. ? Whole grains. ? Beans.  Eat less of foods that are high in fat, low in fiber, or overly processed, such as: ? [place] fries. ? Hamburgers. ? Cookies. ? Candy. ? Soda.  Drink enough fluid to keep your pee (urine) clear or pale yellow. General instructions  Exercise regularly or as told by your  doctor.  Go to the restroom when you feel like you need to poop. Do not hold it in.  Take over-the-counter and prescription medicines only as told by your doctor. These include any fiber supplements.  Do pelvic floor retraining exercises, such as: ? Doing deep breathing while relaxing your lower belly (abdomen). ? Relaxing your pelvic floor while pooping.  Watch your condition for any changes.  Keep all follow-up visits as told by your doctor. This is important. Contact a doctor if:  You have pain that gets worse.  You have a fever.  You have not pooped for 4 days.  You throw up (vomit).  You are not hungry.  You lose weight.  You are bleeding from the  anus.  You have thin, pencil-like poop (stool). Get help right away if:  You have a fever, and your symptoms suddenly get worse.  You leak poop or have blood in your poop.  Your belly feels hard or bigger than normal (is bloated).  You have very bad belly pain.  You feel dizzy or you faint. This information is not intended to replace advice given to you by your health care provider. Make sure you discuss any questions you have with your health care provider. Document Revised: 10/08/2017 Document Reviewed: 04/15/2016 Elsevier Patient Education  2020 [employer].

## 2020-09-26 LAB — HIV ANTIBODY (ROUTINE TESTING W REFLEX): HIV Screen 4th Generation wRfx: NONREACTIVE

## 2020-09-26 LAB — CERVICOVAGINAL ANCILLARY ONLY
Bacterial Vaginitis (gardnerella): NEGATIVE
Candida Glabrata: NEGATIVE
Candida Vaginitis: NEGATIVE
Chlamydia: NEGATIVE
Comment: NEGATIVE
Comment: NEGATIVE
Comment: NEGATIVE
Comment: NEGATIVE
Comment: NEGATIVE
Comment: NORMAL
Neisseria Gonorrhea: NEGATIVE
Trichomonas: NEGATIVE

## 2020-09-26 LAB — HEPATITIS B SURFACE ANTIGEN: Hepatitis B Surface Ag: NEGATIVE

## 2020-09-26 LAB — RPR: RPR Ser Ql: NONREACTIVE

## 2020-09-26 LAB — HEPATITIS C ANTIBODY: Hep C Virus Ab: <0.1 s/co ratio (ref 0.0–0.9)

## 2020-09-27 LAB — CYTOLOGY - PAP
Comment: NEGATIVE
Diagnosis: NEGATIVE
High risk HPV: NEGATIVE

## 2020-10-01 ENCOUNTER — Other Ambulatory Visit: Payer: Self-pay | Admitting: Internal Medicine

## 2020-10-01 DIAGNOSIS — Z1231 Encounter for screening mammogram for malignant neoplasm of breast: Secondary | ICD-10-CM

## 2020-11-15 ENCOUNTER — Other Ambulatory Visit: Payer: Self-pay

## 2020-11-15 ENCOUNTER — Ambulatory Visit
Admission: RE | Admit: 2020-11-15 | Discharge: 2020-11-15 | Disposition: A | Discharge status: Home / Self Care | Payer: Medicare Other | Source: Ambulatory Visit | Attending: Internal Medicine | Admitting: Internal Medicine

## 2020-11-15 DIAGNOSIS — Z1231 Encounter for screening mammogram for malignant neoplasm of breast: Secondary | ICD-10-CM

## 2021-03-17 ENCOUNTER — Other Ambulatory Visit (HOSPITAL_COMMUNITY): Payer: Self-pay | Admitting: Internal Medicine

## 2021-03-17 DIAGNOSIS — M7989 Other specified soft tissue disorders: Secondary | ICD-10-CM

## 2021-03-18 ENCOUNTER — Emergency Department (HOSPITAL_COMMUNITY)
Admission: EM | Admit: 2021-03-18 | Discharge: 2021-03-18 | Disposition: A | Discharge status: Home / Self Care | Payer: Medicare Other | Attending: Emergency Medicine | Admitting: Emergency Medicine

## 2021-03-18 ENCOUNTER — Encounter (HOSPITAL_COMMUNITY): Payer: Self-pay | Admitting: Emergency Medicine

## 2021-03-18 ENCOUNTER — Ambulatory Visit (HOSPITAL_BASED_OUTPATIENT_CLINIC_OR_DEPARTMENT_OTHER)
Admission: RE | Admit: 2021-03-18 | Discharge: 2021-03-18 | Disposition: A | Discharge status: Home / Self Care | Payer: Medicare Other | Source: Ambulatory Visit | Attending: Internal Medicine | Admitting: Internal Medicine

## 2021-03-18 ENCOUNTER — Emergency Department (HOSPITAL_COMMUNITY): Payer: Medicare Other

## 2021-03-18 ENCOUNTER — Encounter (HOSPITAL_COMMUNITY): Payer: Self-pay | Admitting: Radiology

## 2021-03-18 ENCOUNTER — Other Ambulatory Visit: Payer: Self-pay

## 2021-03-18 DIAGNOSIS — F17210 Nicotine dependence, cigarettes, uncomplicated: Secondary | ICD-10-CM | POA: Diagnosis not present

## 2021-03-18 DIAGNOSIS — I10 Essential (primary) hypertension: Secondary | ICD-10-CM | POA: Diagnosis not present

## 2021-03-18 DIAGNOSIS — I82412 Acute embolism and thrombosis of left femoral vein: Secondary | ICD-10-CM | POA: Diagnosis not present

## 2021-03-18 DIAGNOSIS — M7989 Other specified soft tissue disorders: Secondary | ICD-10-CM

## 2021-03-18 DIAGNOSIS — D259 Leiomyoma of uterus, unspecified: Secondary | ICD-10-CM | POA: Insufficient documentation

## 2021-03-18 DIAGNOSIS — Z91040 Latex allergy status: Secondary | ICD-10-CM | POA: Diagnosis not present

## 2021-03-18 HISTORY — DX: Essential (primary) hypertension: I10

## 2021-03-18 LAB — CBC WITH DIFFERENTIAL/PLATELET
Abs Immature Granulocytes: 0.01 10*3/uL (ref 0.00–0.07)
Basophils Absolute: 0 10*3/uL (ref 0.0–0.1)
Basophils Relative: 1 %
Eosinophils Absolute: 0.2 10*3/uL (ref 0.0–0.5)
Eosinophils Relative: 4 %
HCT: 39.6 % (ref 36.0–46.0)
Hemoglobin: 11.9 g/dL — ABNORMAL LOW (ref 12.0–15.0)
Immature Granulocytes: 0 %
Lymphocytes Relative: 37 %
Lymphs Abs: 2 10*3/uL (ref 0.7–4.0)
MCH: 25.8 pg — ABNORMAL LOW (ref 26.0–34.0)
MCHC: 30.1 g/dL (ref 30.0–36.0)
MCV: 85.9 fL (ref 80.0–100.0)
Monocytes Absolute: 0.6 10*3/uL (ref 0.1–1.0)
Monocytes Relative: 10 %
Neutro Abs: 2.7 10*3/uL (ref 1.7–7.7)
Neutrophils Relative %: 48 %
Platelets: 246 10*3/uL (ref 150–400)
RBC: 4.61 MIL/uL (ref 3.87–5.11)
RDW: 13.2 % (ref 11.5–15.5)
WBC: 5.6 10*3/uL (ref 4.0–10.5)
nRBC: 0 % (ref 0.0–0.2)

## 2021-03-18 LAB — BRAIN NATRIURETIC PEPTIDE: B Natriuretic Peptide: 32.1 pg/mL (ref 0.0–100.0)

## 2021-03-18 LAB — COMPREHENSIVE METABOLIC PANEL
ALT: 14 U/L (ref 0–44)
AST: 19 U/L (ref 15–41)
Albumin: 4 g/dL (ref 3.5–5.0)
Alkaline Phosphatase: 60 U/L (ref 38–126)
Anion gap: 5 (ref 5–15)
BUN: 11 mg/dL (ref 8–23)
CO2: 29 mmol/L (ref 22–32)
Calcium: 9.4 mg/dL (ref 8.9–10.3)
Chloride: 105 mmol/L (ref 98–111)
Creatinine, Ser: 0.74 mg/dL (ref 0.44–1.00)
GFR, Estimated: >60 mL/min (ref >60)
Glucose, Bld: 103 mg/dL — ABNORMAL HIGH (ref 70–99)
Potassium: 3.9 mmol/L (ref 3.5–5.1)
Sodium: 139 mmol/L (ref 135–145)
Total Bilirubin: 0.7 mg/dL (ref 0.3–1.2)
Total Protein: 7.4 g/dL (ref 6.5–8.1)

## 2021-03-18 LAB — PROTIME-INR
INR: 0.9 (ref 0.8–1.2)
Prothrombin Time: 12.6 seconds (ref 11.4–15.2)

## 2021-03-18 LAB — TROPONIN I (HIGH SENSITIVITY): Troponin I (High Sensitivity): <2 ng/L (ref <18)

## 2021-03-18 MED ORDER — SODIUM CHLORIDE 0.9 % IV SOLN: Freq: Once | INTRAVENOUS | Status: AC

## 2021-03-18 MED ORDER — HYDROCODONE-ACETAMINOPHEN 5-325 MG PO TABS: 1.0000 | ORAL_TABLET | Freq: Four times a day (QID) | ORAL | 0 refills | Status: DC | PRN

## 2021-03-18 MED ORDER — HEPARIN (PORCINE) 25000 UT/250ML-% IV SOLN
1100.0000 [IU]/h | INTRAVENOUS | Status: DC
  Administered 2021-03-18: 1100 [IU]/h via INTRAVENOUS
  Filled 2021-03-18: qty 250

## 2021-03-18 MED ORDER — IOHEXOL 350 MG/ML SOLN
100.0000 mL | Freq: Once | INTRAVENOUS | Status: AC | PRN
  Administered 2021-03-18: 100 mL via INTRAVENOUS

## 2021-03-18 MED ORDER — HEPARIN BOLUS VIA INFUSION
4000.0000 [IU] | Freq: Once | INTRAVENOUS | Status: AC
  Administered 2021-03-18: 4000 [IU] via INTRAVENOUS
  Filled 2021-03-18: qty 4000

## 2021-03-18 MED ORDER — RIVAROXABAN (XARELTO) VTE STARTER PACK (15 & 20 MG): ORAL_TABLET | ORAL | 0 refills | Status: DC

## 2021-03-18 MED ORDER — RIVAROXABAN (XARELTO) EDUCATION KIT FOR DVT/PE PATIENTS
PACK | Freq: Once | Status: AC
  Filled 2021-03-18: qty 1

## 2021-03-18 NOTE — ED Triage Notes (Signed)
Pt had vascular scan today in [facility] for DVT. Was positive and advised to go to ED. Reports [facility] swelling x week.

## 2021-03-18 NOTE — Progress Notes (Unsigned)
Patient was seen at [facility] for venous duplex exam. Patient is positive for acute DVT in the left lower extremity. Primary doctor advised her to go the hospital. Patient states she is on the way to [facility]. Preliminary report can be found under the CV Procedure tab under Chart Review.   Thanks,  Sharlett Iles, RDMS, RVT

## 2021-03-18 NOTE — ED Notes (Signed)
Patient was assisted to the restroom.

## 2021-03-18 NOTE — ED Notes (Signed)
Pharmacist bedside to do Xarelto teaching.

## 2021-03-18 NOTE — Discharge Instructions (Addendum)
1.  You have a blood clot in your left leg.  You are being started on a blood thinner medication called Xarelto.  Take this medication as per the dosing instructions.  Do not miss a dose.  This medication is to help dissolve the clot and prevent it from breaking off and going into the lungs.  Failure to take the medication could result in enlarging clot and clot in the lungs. 2.  Follow-up with your family doctor within 2 to 3 days for recheck. 3.  Return to emergency department immediately if you get any chest pain, shortness of breath, lightheadedness or other concerning symptoms.

## 2021-03-18 NOTE — ED Provider Notes (Signed)
[facility] DEPT Provider Note   CSN: [id number] Arrival date & time: 03/18/21  0946     History Chief Complaint  Patient presents with  . Leg Swelling    DVT+ LLE    Briana Obrien is a 70 y.o. female.  HPI Patient is referred to the emergency department for evaluation and treatment of an identified left DVT up to the common femoral.  Patient has large swelling of the left leg.  She reports she noticed the swelling about a week ago.  She reports that her daughter really pointed it out.  She was having some discomfort behind the calf but it was her daughter who really noticed the swelling.  This week and she determined she was seek medical care this week.  Ultrasound today showed extensive DVT up to the left common femoral.  Patient reports that she had a long car ride to [place] at the end of April.  She returned to the [facility] area April 30.  Symptoms started shortly thereafter.  She does not specifically endorse shortness of breath.  He does however notice that she is getting much more easily fatigued with any activity and needs to rest.  She denies pleuritic chest pain.  No nausea no vomiting fever or cough.    Past Medical History:  Diagnosis Date  . Hypercholesteremia   . Hypertension     There are no problems to display for this patient.   Past Surgical History:  Procedure Laterality Date  . BREAST CYST INCISION AND DRAINAGE    . HIP SURGERY Bilateral      OB History    Gravida  4   Para      Term      Preterm      AB  1   Living  3     SAB      IAB  1   Ectopic      Multiple      Live Births  3           Family History  Problem Relation Age of Onset  . Breast cancer Sister     Social History   Tobacco Use  . Smoking status: Current Every Day Smoker    Packs/day: 4.00    Types: Cigarettes  . Smokeless tobacco: Never Used  Substance Use Topics  . Alcohol use: No  . Drug use: Never    Home  Medications Prior to Admission medications   Medication Sig Start Date End Date Taking? Authorizing Provider  ciprofloxacin (CIPRO) 500 MG tablet Take 1 tablet (500 mg total) by mouth 2 (two) times daily. Patient not taking: Reported on 09/25/2020 11/15/13   Gregor Hams, MD  Ergocalciferol (VITAMIN D2 PO) Vitamin D2    [provider]  risperiDONE (RISPERDAL) 3 MG tablet Take 3 mg by mouth at bedtime.    [provider]  simvastatin (ZOCOR) 20 MG tablet Take 20 mg by mouth daily.    [provider]  simvastatin (ZOCOR) 20 MG tablet simvastatin 20 mg tablet  Take 1 tablet every day by oral route.    [provider]    Allergies    Latex, Penicillins, and Plavix [clopidogrel]  Review of Systems   Review of Systems 10 systems reviewed and negative except as per HPI Physical Exam Updated Vital Signs BP (!) 149/82   Pulse 73   Temp 98 F (36.7 C) (Oral)   Resp (!) 24   LMP  (  LMP Unknown)   SpO2 98%   Physical Exam Constitutional:      Comments: Patient is alert and nontoxic.  No respiratory distress.  Clear mental status.  Sitting up in the chair and comfortable in appearance.  Well-nourished well-developed.  HENT:     Head: Normocephalic and atraumatic.     Mouth/Throat:     Pharynx: Oropharynx is clear.  Eyes:     Extraocular Movements: Extraocular movements intact.  Cardiovascular:     Rate and Rhythm: Normal rate and regular rhythm.  Pulmonary:     Comments: No respiratory distress.  Breath sounds are soft at the bases bilaterally.  Occasional expiratory wheeze. Abdominal:     General: There is no distension.     Palpations: Abdomen is soft.     Tenderness: There is no abdominal tenderness. There is no guarding.  Musculoskeletal:     Comments: Moderate swelling entirety of the left lower extremity relative to the right.  1+ to 2+ edema.  Dorsalis pedis pulse on the left is 2+ and strong.  The foot is warm and dry.  Neurologic function  is intact.  No real focal area of reproducible discomfort.  Skin:    General: Skin is warm and dry.  Neurological:     General: No focal deficit present.     Mental Status: She is oriented to person, place, and time.     Coordination: Coordination normal.  Psychiatric:        Mood and Affect: Mood normal.     ED Results / Procedures / Treatments   Labs (all labs ordered are listed, but only abnormal results are displayed) Labs Reviewed  COMPREHENSIVE METABOLIC PANEL - Abnormal; Notable for the following components:      Result Value   Glucose, Bld 103 (*)    All other components within normal limits  CBC WITH DIFFERENTIAL/PLATELET - Abnormal; Notable for the following components:   Hemoglobin 11.9 (*)    MCH 25.8 (*)    All other components within normal limits  BRAIN NATRIURETIC PEPTIDE  PROTIME-INR  HEPARIN LEVEL (UNFRACTIONATED)  TROPONIN I (HIGH SENSITIVITY)  TROPONIN I (HIGH SENSITIVITY)    EKG EKG Interpretation  Date/Time:  Tuesday Mar 18 2021 11:34:47 EDT Ventricular Rate:  68 PR Interval:  146 QRS Duration: 96 QT Interval:  402 QTC Calculation: 428 R Axis:   57 Text Interpretation: Sinus rhythm no sig change from prevous Confirmed by Charlesetta Shanks [phone]) on 03/18/2021 3:28:33 PM   Radiology CT Angio Chest PE W/Cm &/Or Wo Cm  Result Date: 03/18/2021 CLINICAL DATA:  Rule out venous thrombosis in the inferior vena cava or higher. LEFT leg DVT found today. LEFT leg swelling and pain. A long car ride few days ago EXAM: CT ANGIOGRAPHY CHEST CT ABDOMEN AND PELVIS WITH CONTRAST TECHNIQUE: Multidetector CT imaging of the chest was performed using the standard protocol during bolus administration of intravenous contrast. Multiplanar CT image reconstructions and MIPs were obtained to evaluate the vascular anatomy. Multidetector CT imaging of the abdomen and pelvis was performed using the standard protocol during bolus administration of intravenous contrast. CONTRAST:   133m OMNIPAQUE IOHEXOL 350 MG/ML SOLN COMPARISON:  None. FINDINGS: CTA CHEST FINDINGS Cardiovascular: Satisfactory opacification of the pulmonary arteries to the segmental level. No evidence of pulmonary embolism. Normal heart size. No pericardial effusion. Mediastinum/Nodes: No enlarged mediastinal, hilar, or axillary lymph nodes. Thyroid gland, trachea, and esophagus demonstrate no significant findings. Lungs/Pleura: Lungs are clear. No pleural effusion or pneumothorax. Musculoskeletal: Minimal  degenerative change in the spine. Review of the MIP images confirms the above findings. CT ABDOMEN and PELVIS FINDINGS Hepatobiliary: None liver Pancreas: Unremarkable. No pancreatic ductal dilatation or surrounding inflammatory changes. Spleen: Normal in size without focal abnormality. Adrenals/Urinary Tract: Adrenal glands are normal. Kidneys are symmetric in size and excretion. Visualized portions of the ureters are unremarkable. Distal aspect of both ureters is partially obscured by metal artifact from bilateral hip arthroplasty. Visualized portion of the bladder is unremarkable. Stomach/Bowel: The stomach and small bowel loops are normal in appearance. The appendix is well seen and has a normal appearance. Vascular/Lymphatic: There is atherosclerotic calcification of the abdominal aorta, not associated with aneurysm. There is normal vascular opacification of the celiac axis, superior mesenteric artery, and inferior mesenteric artery. Normal appearance of the portal venous system and inferior vena cava. No evidence for deep vein thrombosis. Reproductive: The uterus is present, enlarged, and globular. Numerous partially calcified fibroids are present. Uterus measures at least 10 x 5.3 by 7.7 centimeters. No adnexal mass. Other: No ascites.  Anterior abdominal wall is unremarkable. Musculoskeletal: Bilateral hip arthroplasty. No acute fracture or subluxation. Review of the MIP images confirms the above findings.  IMPRESSION: 1. No evidence for pulmonary embolus. 2. No evidence for deep vein thrombosis of the abdomen or pelvis. 3. Normal appendix. 4.  Aortic atherosclerosis.  (ICD10-I70.0) 5. Uterus is enlarged from numerous partially calcified fibroids. 6. Bilateral hip arthroplasty and associated artifact. Electronically Signed   By: Nolon Nations M.D.   On: 03/18/2021 14:42   CT Abdomen Pelvis W Contrast  Result Date: 03/18/2021 CLINICAL DATA:  Rule out venous thrombosis in the inferior vena cava or higher. LEFT leg DVT found today. LEFT leg swelling and pain. A long car ride few days ago EXAM: CT ANGIOGRAPHY CHEST CT ABDOMEN AND PELVIS WITH CONTRAST TECHNIQUE: Multidetector CT imaging of the chest was performed using the standard protocol during bolus administration of intravenous contrast. Multiplanar CT image reconstructions and MIPs were obtained to evaluate the vascular anatomy. Multidetector CT imaging of the abdomen and pelvis was performed using the standard protocol during bolus administration of intravenous contrast. CONTRAST:  133m OMNIPAQUE IOHEXOL 350 MG/ML SOLN COMPARISON:  None. FINDINGS: CTA CHEST FINDINGS Cardiovascular: Satisfactory opacification of the pulmonary arteries to the segmental level. No evidence of pulmonary embolism. Normal heart size. No pericardial effusion. Mediastinum/Nodes: No enlarged mediastinal, hilar, or axillary lymph nodes. Thyroid gland, trachea, and esophagus demonstrate no significant findings. Lungs/Pleura: Lungs are clear. No pleural effusion or pneumothorax. Musculoskeletal: Minimal degenerative change in the spine. Review of the MIP images confirms the above findings. CT ABDOMEN and PELVIS FINDINGS Hepatobiliary: None liver Pancreas: Unremarkable. No pancreatic ductal dilatation or surrounding inflammatory changes. Spleen: Normal in size without focal abnormality. Adrenals/Urinary Tract: Adrenal glands are normal. Kidneys are symmetric in size and excretion.  Visualized portions of the ureters are unremarkable. Distal aspect of both ureters is partially obscured by metal artifact from bilateral hip arthroplasty. Visualized portion of the bladder is unremarkable. Stomach/Bowel: The stomach and small bowel loops are normal in appearance. The appendix is well seen and has a normal appearance. Vascular/Lymphatic: There is atherosclerotic calcification of the abdominal aorta, not associated with aneurysm. There is normal vascular opacification of the celiac axis, superior mesenteric artery, and inferior mesenteric artery. Normal appearance of the portal venous system and inferior vena cava. No evidence for deep vein thrombosis. Reproductive: The uterus is present, enlarged, and globular. Numerous partially calcified fibroids are present. Uterus measures at least 10 x  5.3 by 7.7 centimeters. No adnexal mass. Other: No ascites.  Anterior abdominal wall is unremarkable. Musculoskeletal: Bilateral hip arthroplasty. No acute fracture or subluxation. Review of the MIP images confirms the above findings. IMPRESSION: 1. No evidence for pulmonary embolus. 2. No evidence for deep vein thrombosis of the abdomen or pelvis. 3. Normal appendix. 4.  Aortic atherosclerosis.  (ICD10-I70.0) 5. Uterus is enlarged from numerous partially calcified fibroids. 6. Bilateral hip arthroplasty and associated artifact. Electronically Signed   By: Nolon Nations M.D.   On: 03/18/2021 14:42   VAS [place] LOWER EXTREMITY VENOUS (DVT)  Result Date: 03/18/2021  Lower Venous DVT Study Patient Name:  [facility]  Date of Exam:   03/18/2021 Medical Rec #: [id number]    Accession #:    [id number] Date of Birth: 29-Mar-1951    Patient Gender: F Patient Age:   79Y Exam Location:  [facility] Procedure:      VAS [place] LOWER EXTREMITY VENOUS (DVT) Referring Phys: [id number] EDWIN AVBUERE --------------------------------------------------------------------------------  Indications: Patient reports left lower extremity swelling  and pain x 1 week. She states it has been exactly two weeks since traveling from [place]. She denies any SOB.  Risk Factors: Recent extended travel. Comparison Study: NA Performing Technologist: Sharlett Iles RVT  Examination Guidelines: A complete evaluation includes B-mode imaging, spectral Doppler, color Doppler, and power Doppler as needed of all accessible portions of each vessel. Bilateral testing is considered an integral part of a complete examination. Limited examinations for reoccurring indications may be performed as noted. The reflux portion of the exam is performed with the patient in reverse Trendelenburg.  +---------+---------------+---------+-----------+----------+--------------+ RIGHT    CompressibilityPhasicitySpontaneityPropertiesThrombus Aging +---------+---------------+---------+-----------+----------+--------------+ CFV      Full           Yes      Yes                                 +---------+---------------+---------+-----------+----------+--------------+ SFJ      Full           Yes      Yes                                 +---------+---------------+---------+-----------+----------+--------------+ FV Prox  Full           Yes      Yes                                 +---------+---------------+---------+-----------+----------+--------------+ FV Mid   Full                                                        +---------+---------------+---------+-----------+----------+--------------+ FV DistalFull           Yes      Yes                                 +---------+---------------+---------+-----------+----------+--------------+ PFV      Full           Yes      Yes                                 +---------+---------------+---------+-----------+----------+--------------+  POP      Full           Yes      Yes                                 +---------+---------------+---------+-----------+----------+--------------+ PTV      Full            Yes      Yes                                 +---------+---------------+---------+-----------+----------+--------------+ PERO     Full           Yes      Yes                                 +---------+---------------+---------+-----------+----------+--------------+ Gastroc  Full                                                        +---------+---------------+---------+-----------+----------+--------------+ GSV      Full           Yes      Yes                                 +---------+---------------+---------+-----------+----------+--------------+   Right Technical Findings: Venous insuffiencey is noted in the popliteal vein and tibioperoneal confluence.  +---------+---------------+---------+-----------+---------------+--------------+ LEFT     CompressibilityPhasicitySpontaneityProperties     Thrombus Aging +---------+---------------+---------+-----------+---------------+--------------+ CFV      Partial        Yes      Yes        softly         Acute                                                      echogenic                     +---------+---------------+---------+-----------+---------------+--------------+ SFJ      Partial        Yes      Yes        softly         Acute                                                      echogenic                     +---------+---------------+---------+-----------+---------------+--------------+ FV Prox  None           No       No         softly         Acute  echogenic                     +---------+---------------+---------+-----------+---------------+--------------+ FV Mid   None           No       No         softly         Acute                                                      echogenic                     +---------+---------------+---------+-----------+---------------+--------------+ FV DistalNone           No       No          softly         Acute                                                      echogenic                     +---------+---------------+---------+-----------+---------------+--------------+ PFV      None           No       No         softly         Acute                                                      echogenic                     +---------+---------------+---------+-----------+---------------+--------------+ POP      None           No       No         softly         Acute                                                      echogenic                     +---------+---------------+---------+-----------+---------------+--------------+ PTV      None           No       No         softly         Acute                                                      echogenic                     +---------+---------------+---------+-----------+---------------+--------------+  PERO     Full                    No                                       +---------+---------------+---------+-----------+---------------+--------------+ Gastroc  Full                                                             +---------+---------------+---------+-----------+---------------+--------------+ GSV      Full           Yes      Yes                                      +---------+---------------+---------+-----------+---------------+--------------+ SSV      None           No       No         softly         Acute                                                      echogenic                     +---------+---------------+---------+-----------+---------------+--------------+   Left Technical Findings: Not visualized segments include mid and distal common iliac vein, as well as proximal and mid segment of the external iliac vein. Although there is flow noted in the proximal and mid external iliac vein there is limited visualization to determine if thrombus is in  those areas. Acute non occlusive thrombus in the distal external iliac vein. Acute non occlusive thrombus in the common femoral vein and saphenofemoral junction. Acute occlusive thrombus throughout the entire length of the femoral vein, popliteal vein, saphenopopliteal junction and proximal small saphenous vein. Acute non occlusive thrombus in the mid small saphenous vein.  Findings reported to Dr. Nolene Ebbs at 9:23. Advised to go to the hospital for treatment.  Summary: RIGHT: - No evidence of deep vein thrombosis in the lower extremity. No indirect evidence of obstruction proximal to the inguinal ligament. - No cystic structure found in the popliteal fossa. - Venous insuffiencey is noted in the popliteal vein and tibioperoneal confluence.  LEFT: - Findings consistent with acute deep vein thrombosis involving the left common femoral vein, SF junction, left femoral vein, left proximal profunda vein, left popliteal vein, left posterior tibial veins, left peroneal veins, and distal external iliac Vein. - Findings consistent with acute superficial vein thrombosis involving the left small saphenous vein. - No cystic structure found in the popliteal fossa.  *See table(s) above for measurements and observations. Electronically signed by Jenkins Rouge MD on 03/18/2021 at 10:30:16 AM.    Final     Procedures Procedures   Medications Ordered in ED Medications  rivaroxaban Alveda Reasons) Education Kit for DVT/PE patients (has no administration in time range)  0.9 %  sodium chloride infusion ( Intravenous New Bag/Given  03/18/21 1153)  heparin bolus via infusion 4,000 Units (4,000 Units Intravenous Bolus from Bag 03/18/21 1157)  iohexol (OMNIPAQUE) 350 MG/ML injection 100 mL (100 mLs Intravenous Contrast Given 03/18/21 1346)    ED Course  I have reviewed the triage vital signs and the nursing notes.  Pertinent labs & imaging results that were available during my care of the patient were reviewed by me and considered  in my medical decision making (see chart for details).  Clinical Course as of 03/18/21 1528  Tue Mar 18, 2021  1523 Consult: I have reviewed with Dr. Donnetta Hutching the findings on CT scan and ultrasound.  At this time with good pulses and warm extremity, recommends proceeding with oral anticoagulant and outpatient follow-up with PCP. [MP]    Clinical Course User Index [MP] Charlesetta Shanks, MD   MDM Rules/Calculators/A&P                          Patient presents with a large left DVT to the femoral vein.  She is alert and nontoxic.  No respiratory distress.  Patient is not hypoxic.  Due to risk of large clot, I did proceed with CT angiogram and ruled out pulmonary embolus.  Also no clot progression higher than the femoral vein.  Clinically, patient is stable.  I have reviewed planning with Dr. Donnetta Hutching.  He has reviewed the CT scans.  At this time without phlegmasia Dolan's and patient being vascularly intact, suggests starting Xarelto and close outpatient follow-up with PCP. Final Clinical Impression(s) / ED Diagnoses Final diagnoses:  Acute deep vein thrombosis (DVT) of femoral vein of left lower extremity ([place])    Rx / DC Orders ED Discharge Orders    None       Charlesetta Shanks, MD 03/18/21 1530

## 2021-03-18 NOTE — ED Notes (Signed)
An After Visit Summary was printed and given to the patient. Discharge instructions given and no further questions at this time.  

## 2021-03-18 NOTE — Progress Notes (Signed)
ANTICOAGULATION CONSULT NOTE - Initial Consult  Pharmacy Consult for heparin Indication: DVT  Allergies  Allergen Reactions  . Latex Itching  . Penicillins Swelling  . Plavix [Clopidogrel] Itching    Patient Measurements: TBW ~ 150lbs in 09/2020 Heparin Dosing Weight:   Vital Signs: Temp: 97.9 F (36.6 C) (05/10 0953) Temp Source: Oral (05/10 0953) BP: 190/88 (05/10 0953) Pulse Rate: 83 (05/10 0953)  Labs: No results for input(s): HGB, HCT, PLT, APTT, LABPROT, INR, HEPARINUNFRC, HEPRLOWMOCWT, CREATININE, CKTOTAL, CKMB, TROPONINIHS in the last 72 hours.  CrCl cannot be calculated (No successful lab value found.).  Assessment: 70 yo F presents with leg swelling and identified extensive left DVT. No anticoag PTA. Pharmacy consulted for heparin.  Goal of Therapy:  Heparin level 0.3-0.7 units/ml Monitor platelets by anticoagulation protocol: Yes   Plan:  Give heparin 4,000 unit bolus Start heparin gtt at 1,100 units/hr Check heparin level later this evening Monitor daily heparin level, CBC, s/s of bleed  Elenor Quinones, PharmD, BCPS, BCIDP Clinical Pharmacist 03/18/2021 10:54 AM

## 2021-10-27 ENCOUNTER — Other Ambulatory Visit: Payer: Self-pay | Admitting: Internal Medicine

## 2021-10-27 DIAGNOSIS — Z1231 Encounter for screening mammogram for malignant neoplasm of breast: Secondary | ICD-10-CM

## 2021-11-15 IMAGING — MG DIGITAL SCREENING BILAT W/ TOMO W/ CAD
6 of 10 series · 6 of 30 positions shown · non-contrast
Comparison: Previous exam(s).

CLINICAL DATA: Screening.

EXAM:
DIGITAL SCREENING BILATERAL MAMMOGRAM WITH TOMO AND CAD

[R MLO synth-2D (1 of 2)]
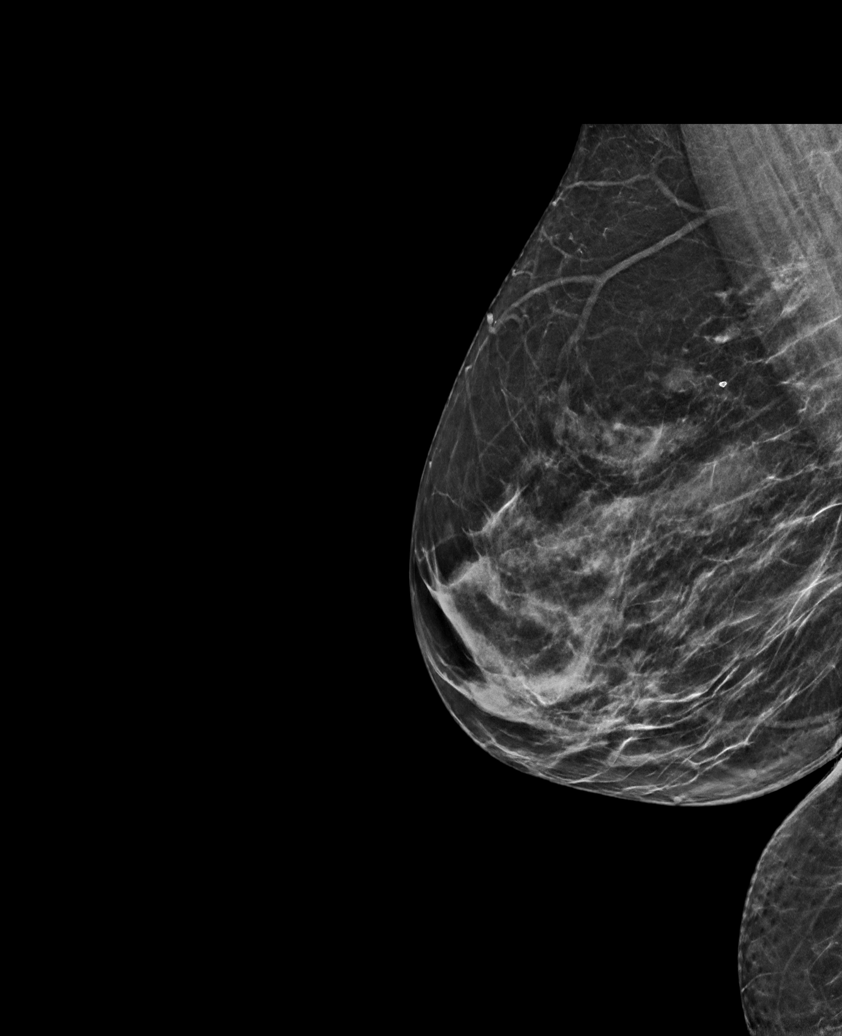

[R MLO synth-2D (2 of 2)]
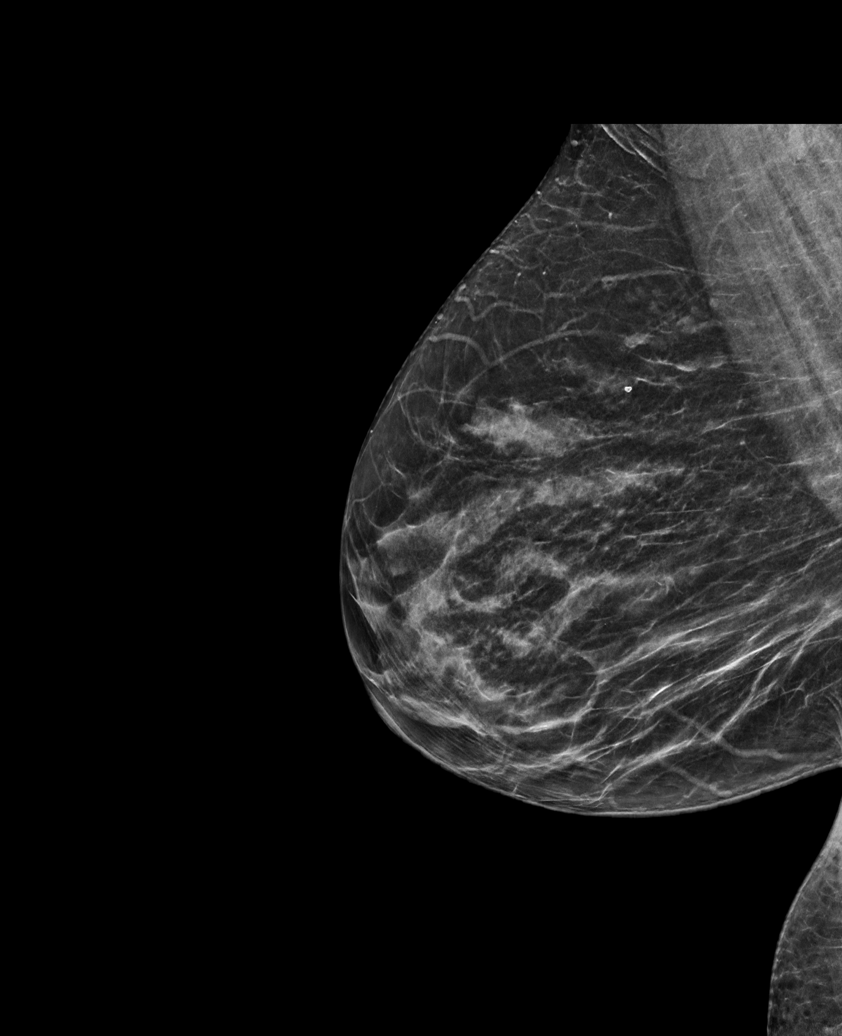

[L CC synth-2D]
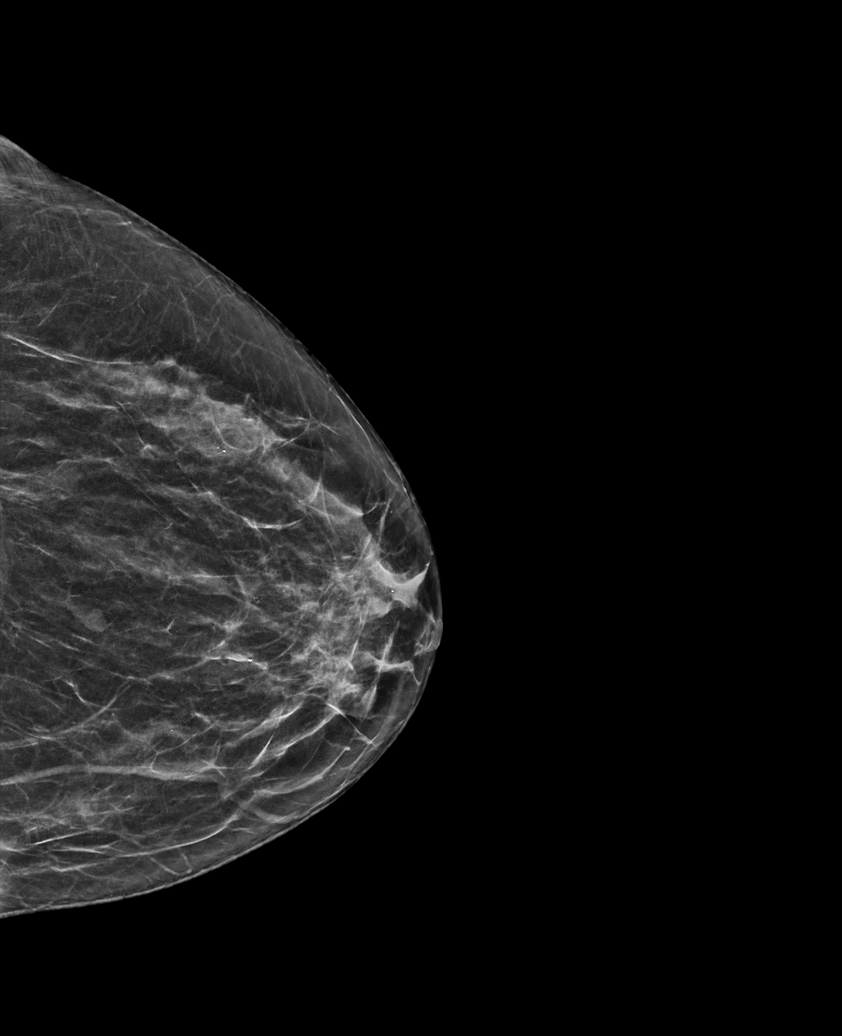

[L MLO synth-2D]
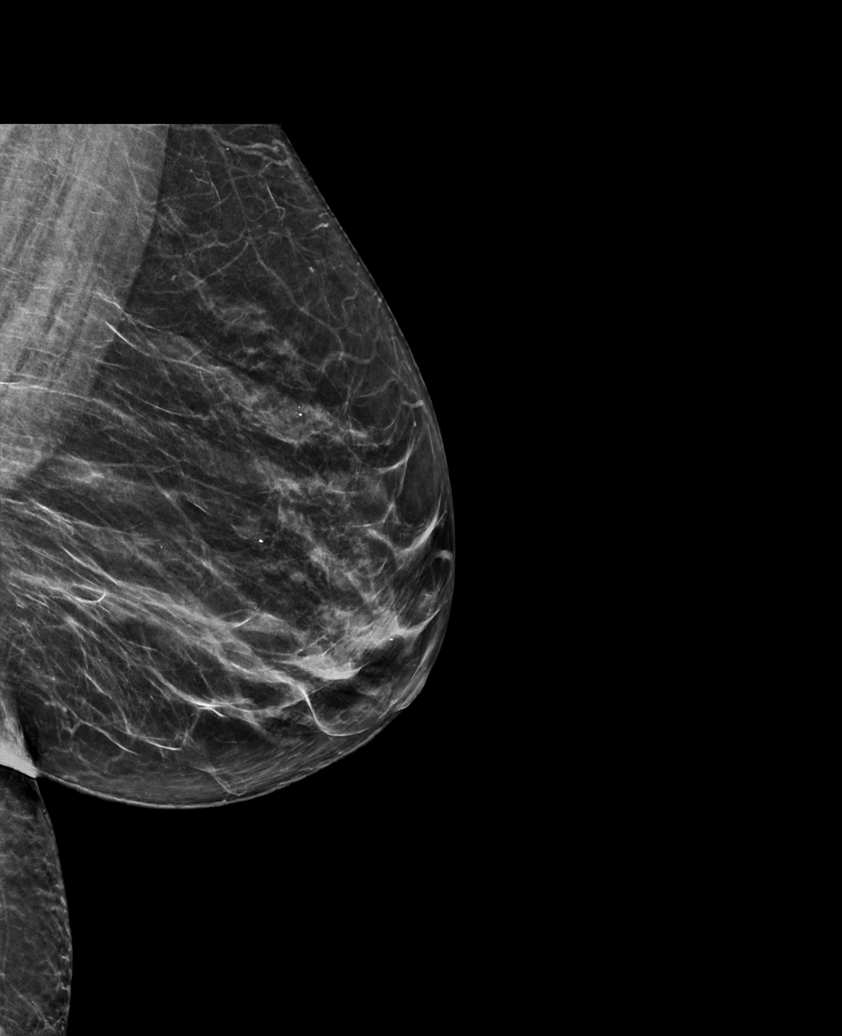

[R CC synth-2D]
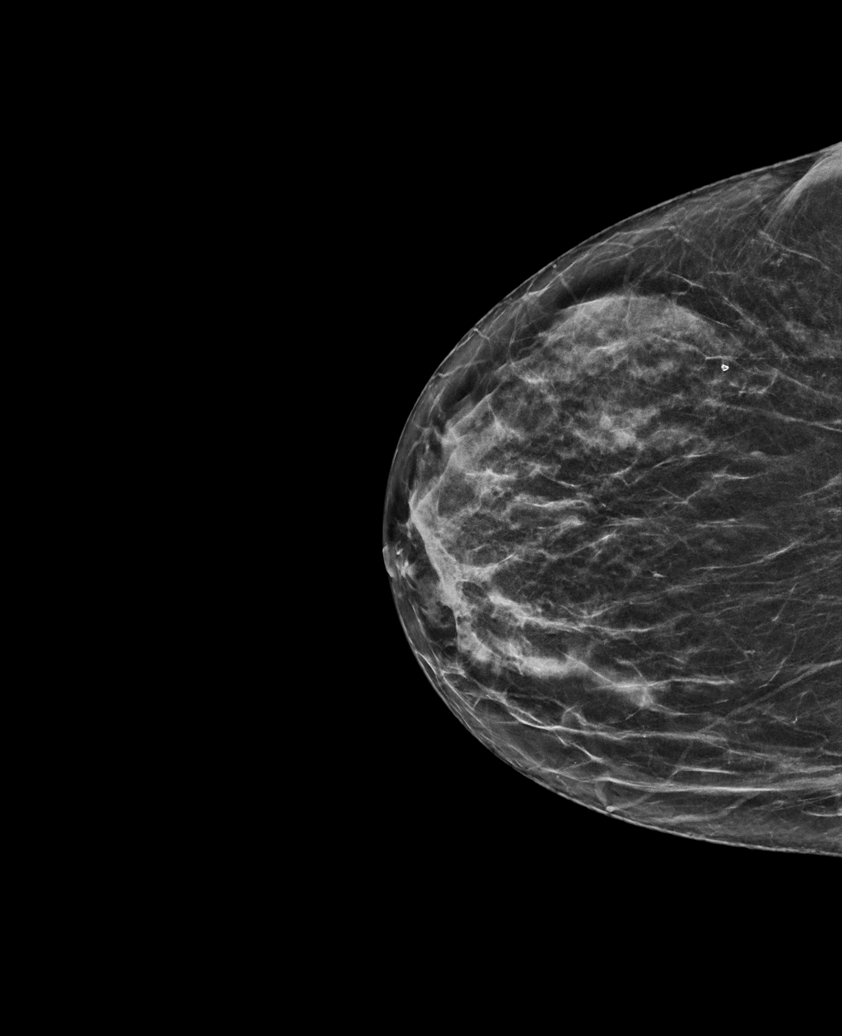

[R MLO tomo · tomo slice 31/60.0]
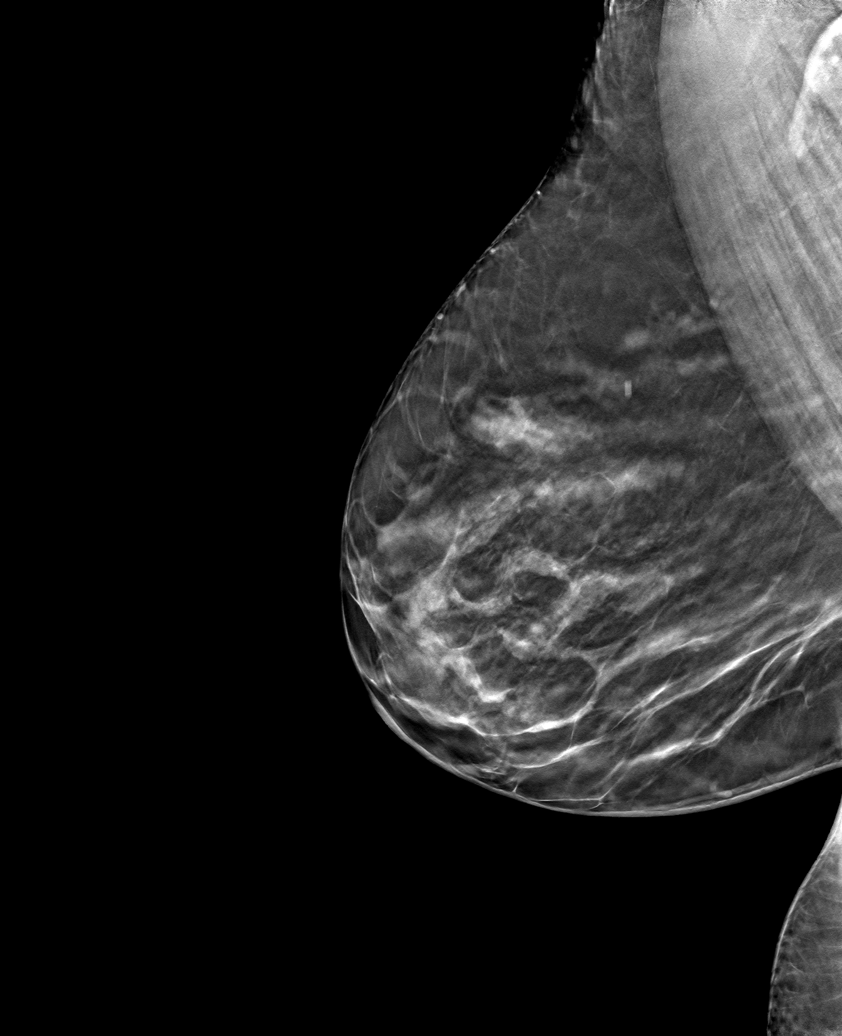

[6 of 30 positions shown; findings below may reference images not displayed]

ACR Breast Density Category c: The breast tissue is heterogeneously
dense, which may obscure small masses.
FINDINGS: There are no findings suspicious for malignancy. Images were
processed with CAD.
IMPRESSION: No mammographic evidence of malignancy. A result letter of this
screening mammogram will be mailed directly to the patient.

RECOMMENDATION:
Screening mammogram in one year. (Code:FT-U-LHB)

BI-RADS CATEGORY  1: Negative.

## 2021-11-25 ENCOUNTER — Ambulatory Visit: Payer: Medicare Other

## 2021-12-02 ENCOUNTER — Ambulatory Visit
Admission: RE | Admit: 2021-12-02 | Discharge: 2021-12-02 | Disposition: A | Discharge status: Home / Self Care | Payer: Medicare Other | Source: Ambulatory Visit | Attending: Internal Medicine | Admitting: Internal Medicine

## 2021-12-02 DIAGNOSIS — Z1231 Encounter for screening mammogram for malignant neoplasm of breast: Secondary | ICD-10-CM

## 2022-01-20 ENCOUNTER — Other Ambulatory Visit: Payer: Self-pay | Admitting: Internal Medicine

## 2022-01-20 LAB — COMPLETE METABOLIC PANEL WITH GFR
AG Ratio: 1.5 (calc) (ref 1.0–2.5)
ALT: 13 U/L (ref 6–29)
AST: 18 U/L (ref 10–35)
Albumin: 4.4 g/dL (ref 3.6–5.1)
Alkaline phosphatase (APISO): 73 U/L (ref 37–153)
BUN: 14 mg/dL (ref 7–25)
CO2: 23 mmol/L (ref 20–32)
Calcium: 9.5 mg/dL (ref 8.6–10.4)
Chloride: 102 mmol/L (ref 98–110)
Creat: 0.79 mg/dL (ref 0.50–1.05)
Globulin: 3 g/dL (calc) (ref 1.9–3.7)
Glucose, Bld: 86 mg/dL (ref 65–99)
Potassium: 4.5 mmol/L (ref 3.5–5.3)
Sodium: 139 mmol/L (ref 135–146)
Total Bilirubin: 0.7 mg/dL (ref 0.2–1.2)
Total Protein: 7.4 g/dL (ref 6.1–8.1)
eGFR: 81 mL/min/{1.73_m2} (ref >=60)

## 2022-01-20 LAB — CBC
HCT: 45.5 % — ABNORMAL HIGH (ref 35.0–45.0)
Hemoglobin: 14.1 g/dL (ref 11.7–15.5)
MCH: 25.6 pg — ABNORMAL LOW (ref 27.0–33.0)
MCHC: 31 g/dL — ABNORMAL LOW (ref 32.0–36.0)
MCV: 82.7 fL (ref 80.0–100.0)
MPV: 11 fL (ref 7.5–12.5)
Platelets: 258 10*3/uL (ref 140–400)
RBC: 5.5 10*6/uL — ABNORMAL HIGH (ref 3.80–5.10)
RDW: 12.7 % (ref 11.0–15.0)
WBC: 4.7 10*3/uL (ref 3.8–10.8)

## 2022-01-20 LAB — LIPID PANEL
Cholesterol: 169 mg/dL (ref <200)
HDL: 45 mg/dL — ABNORMAL LOW (ref >=50)
LDL Cholesterol (Calc): 98 mg/dL (calc)
Non-HDL Cholesterol (Calc): 124 mg/dL (calc) (ref <130)
Total CHOL/HDL Ratio: 3.8 (calc) (ref <5.0)
Triglycerides: 163 mg/dL — ABNORMAL HIGH (ref <150)

## 2022-01-20 LAB — VITAMIN D 25 HYDROXY (VIT D DEFICIENCY, FRACTURES): Vit D, 25-Hydroxy: 59 ng/mL (ref 30–100)

## 2022-01-20 LAB — TSH: TSH: 3.23 mIU/L (ref 0.40–4.50)

## 2022-11-06 ENCOUNTER — Other Ambulatory Visit: Payer: Self-pay | Admitting: Internal Medicine

## 2022-11-06 DIAGNOSIS — Z1231 Encounter for screening mammogram for malignant neoplasm of breast: Secondary | ICD-10-CM

## 2022-12-02 IMAGING — MG MM DIGITAL SCREENING BILAT W/ TOMO AND CAD
6 of 10 series · 6 of 30 positions shown · non-contrast
Comparison: Previous exam(s).

CLINICAL DATA: Screening.

EXAM:
DIGITAL SCREENING BILATERAL MAMMOGRAM WITH TOMOSYNTHESIS AND CAD
TECHNIQUE: Bilateral screening digital craniocaudal and mediolateral oblique
mammograms were obtained. Bilateral screening digital breast
tomosynthesis was performed. The images were evaluated with
computer-aided detection.

[R XCCL synth-2D]
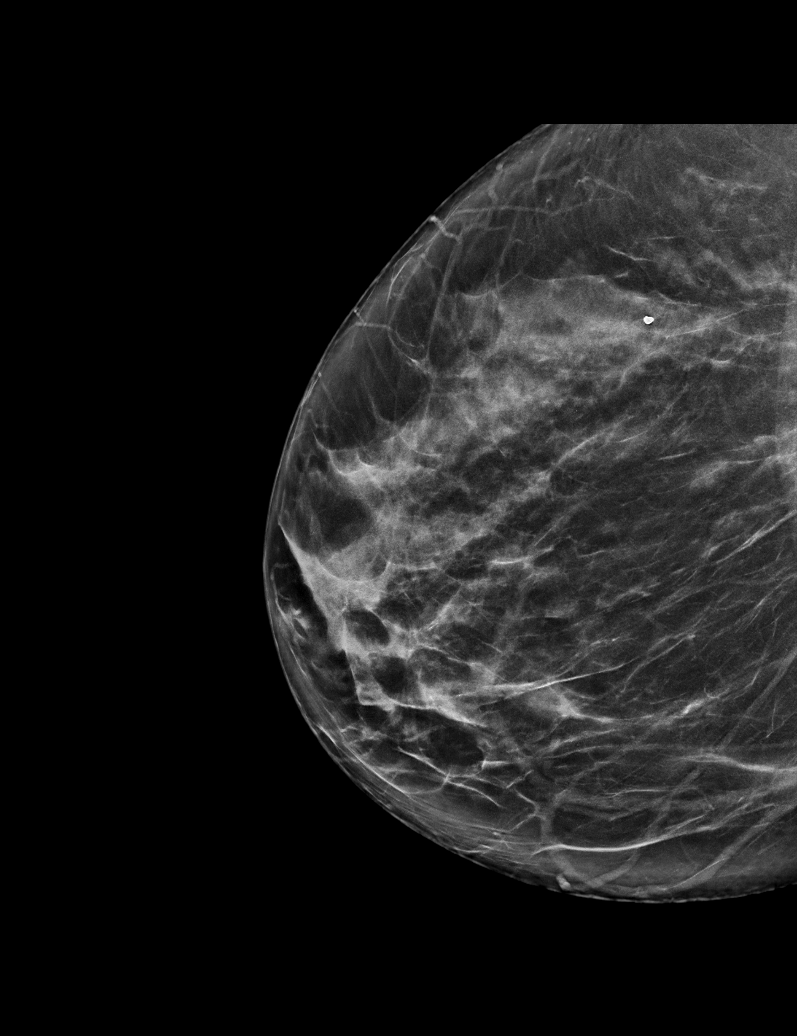

[L CC synth-2D]
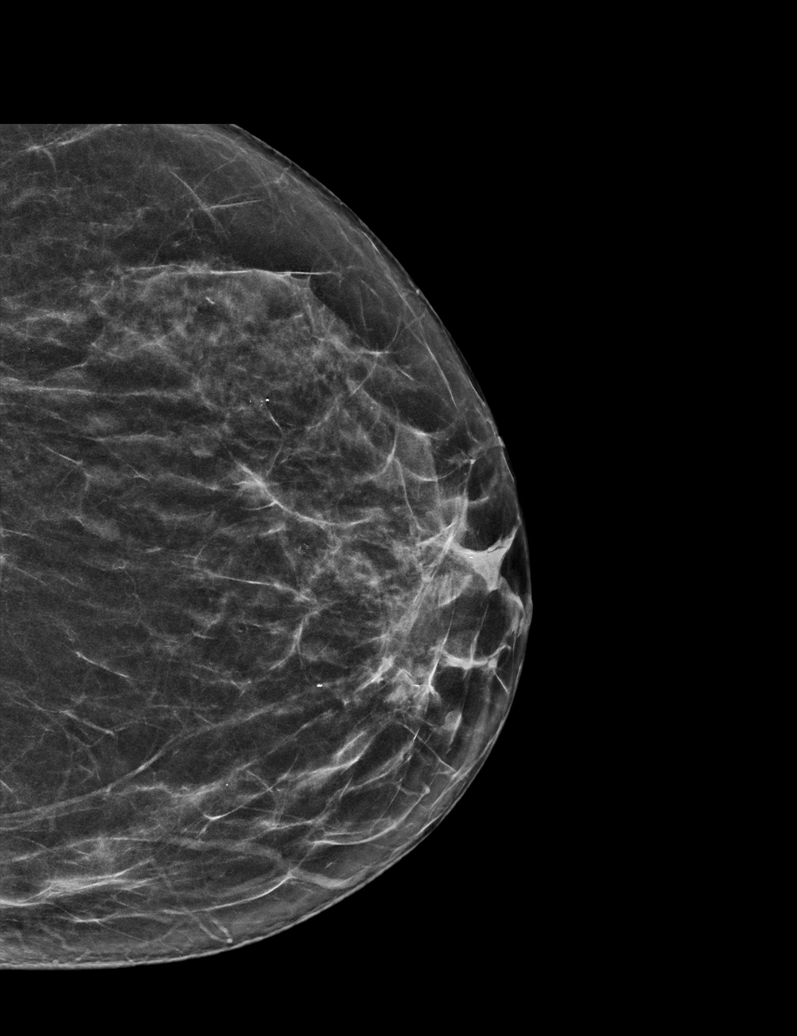

[R CC synth-2D]
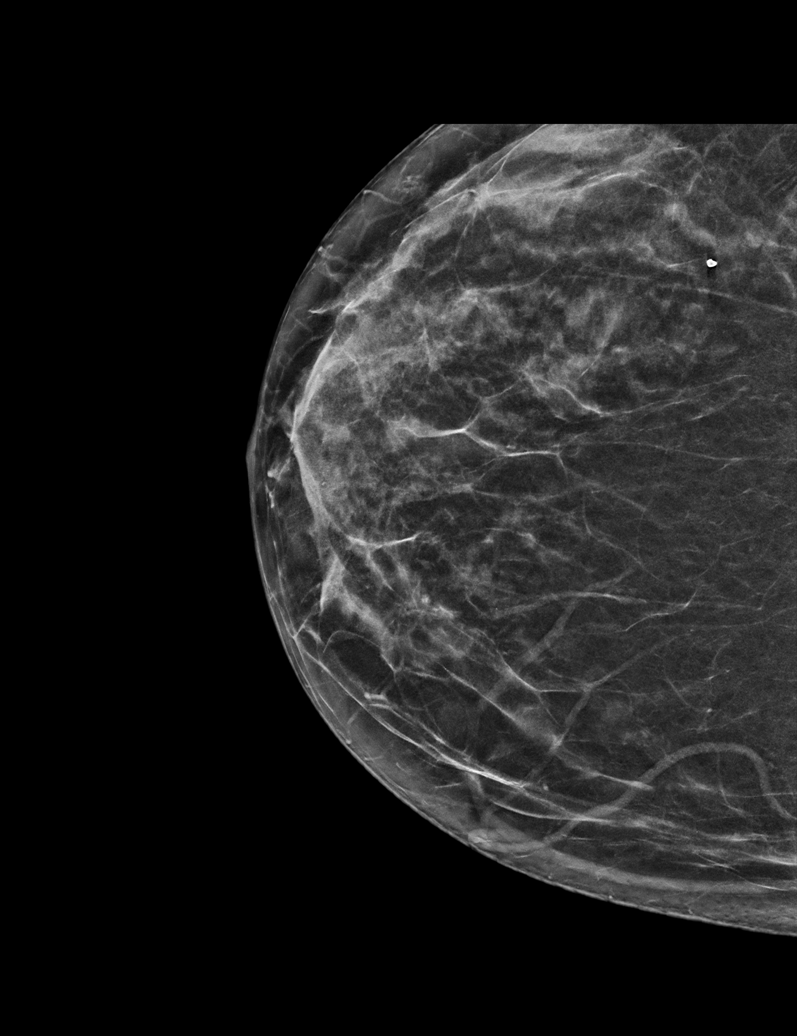

[R MLO synth-2D]
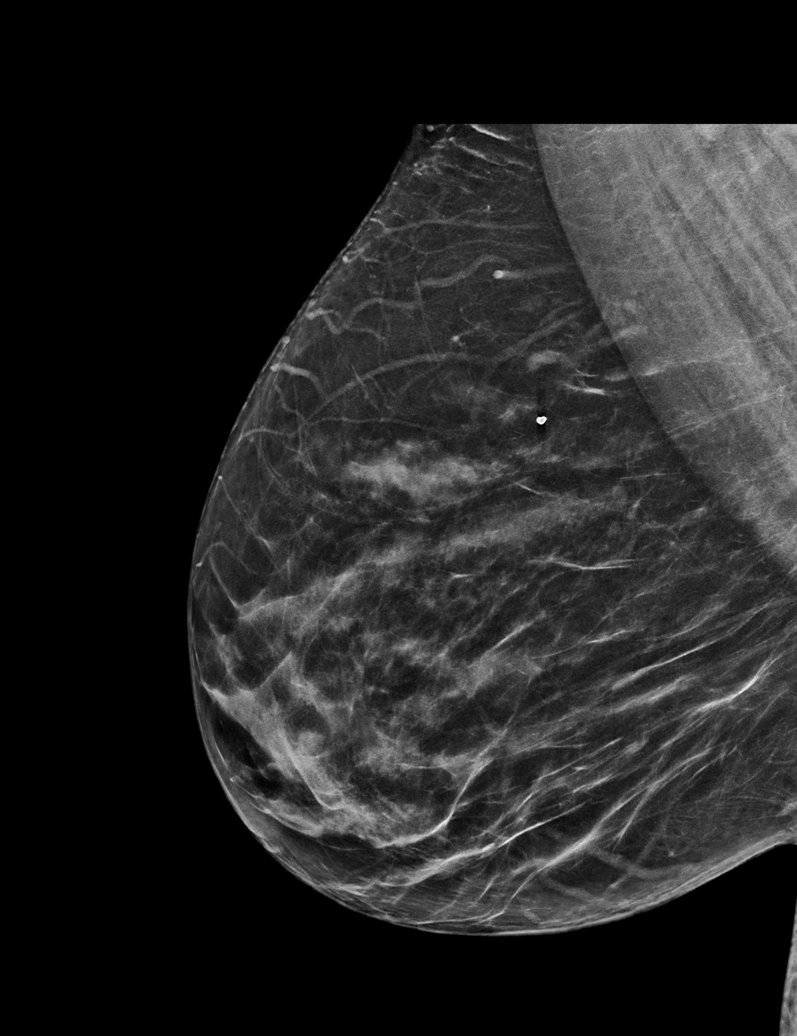

[L MLO synth-2D]
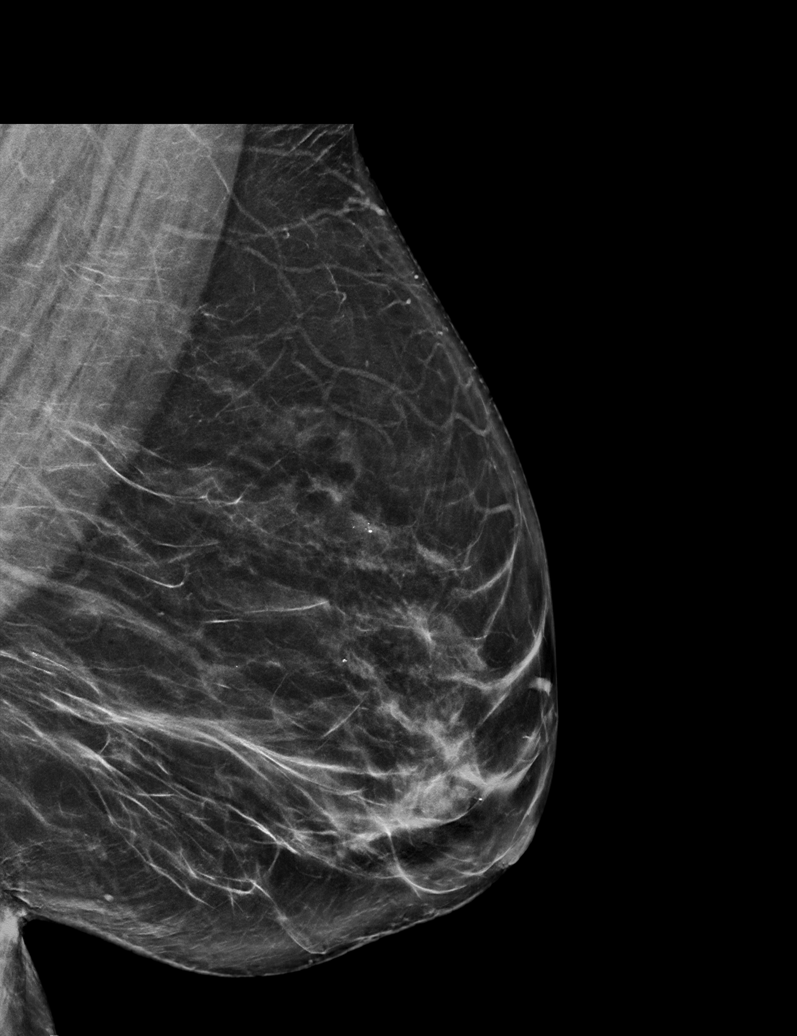

[R CC tomo · tomo slice 27/53.0]
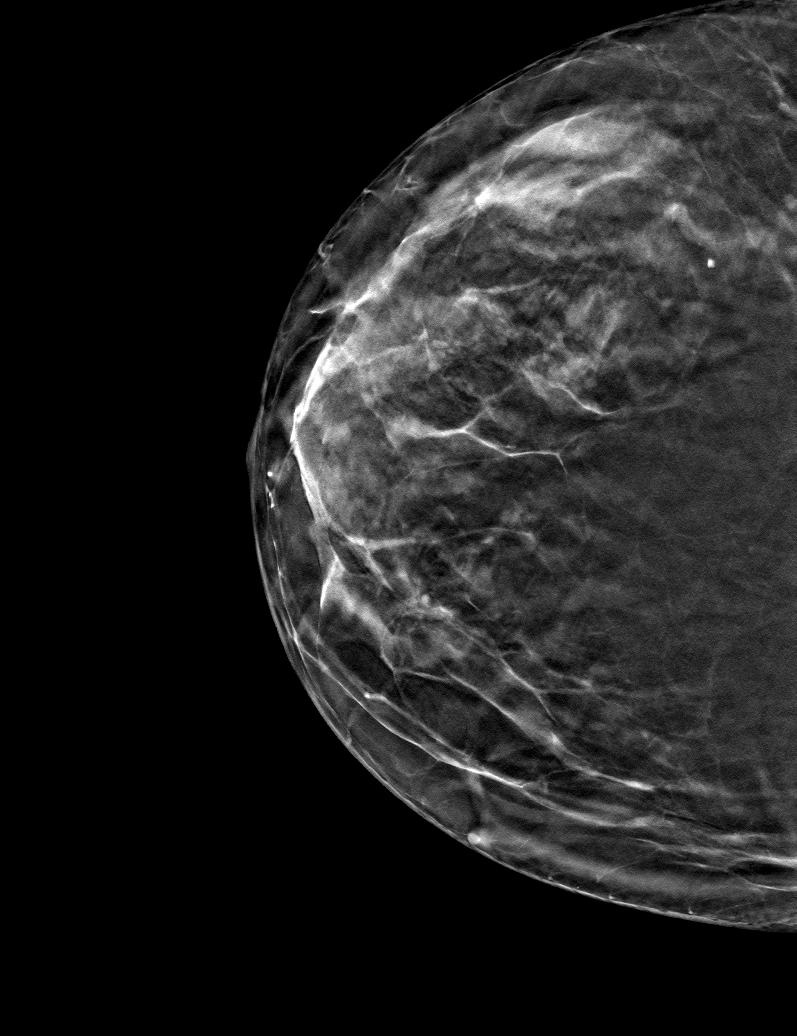

[6 of 30 positions shown; findings below may reference images not displayed]

ACR Breast Density Category b: There are scattered areas of
fibroglandular density.
FINDINGS: There are no findings suspicious for malignancy.
IMPRESSION: No mammographic evidence of malignancy. A result letter of this
screening mammogram will be mailed directly to the patient.

RECOMMENDATION:
Screening mammogram in one year. (Code:51-O-LD2)

BI-RADS CATEGORY  1: Negative.

## 2022-12-29 ENCOUNTER — Ambulatory Visit
Admission: RE | Admit: 2022-12-29 | Discharge: 2022-12-29 | Disposition: A | Discharge status: Home / Self Care | Payer: Medicare Other | Source: Ambulatory Visit | Attending: Internal Medicine | Admitting: Internal Medicine

## 2022-12-29 DIAGNOSIS — Z1231 Encounter for screening mammogram for malignant neoplasm of breast: Secondary | ICD-10-CM

## 2023-01-18 ENCOUNTER — Other Ambulatory Visit: Payer: Self-pay | Admitting: Internal Medicine

## 2023-01-19 LAB — COMPLETE METABOLIC PANEL WITH GFR
AG Ratio: 1.5 (calc) (ref 1.0–2.5)
ALT: 26 U/L (ref 6–29)
AST: 22 U/L (ref 10–35)
Albumin: 4.2 g/dL (ref 3.6–5.1)
Alkaline phosphatase (APISO): 83 U/L (ref 37–153)
BUN: 13 mg/dL (ref 7–25)
CO2: 28 mmol/L (ref 20–32)
Calcium: 9.2 mg/dL (ref 8.6–10.4)
Chloride: 103 mmol/L (ref 98–110)
Creat: 0.71 mg/dL (ref 0.60–1.00)
Globulin: 2.8 g/dL (calc) (ref 1.9–3.7)
Glucose, Bld: 116 mg/dL — ABNORMAL HIGH (ref 65–99)
Potassium: 4.3 mmol/L (ref 3.5–5.3)
Sodium: 139 mmol/L (ref 135–146)
Total Bilirubin: 0.6 mg/dL (ref 0.2–1.2)
Total Protein: 7 g/dL (ref 6.1–8.1)
eGFR: 91 mL/min/{1.73_m2} (ref >=60)

## 2023-01-19 LAB — TSH: TSH: 2.85 mIU/L (ref 0.40–4.50)

## 2023-01-19 LAB — CBC
HCT: 42.1 % (ref 35.0–45.0)
Hemoglobin: 12.9 g/dL (ref 11.7–15.5)
MCH: 25.2 pg — ABNORMAL LOW (ref 27.0–33.0)
MCHC: 30.6 g/dL — ABNORMAL LOW (ref 32.0–36.0)
MCV: 82.4 fL (ref 80.0–100.0)
MPV: 10 fL (ref 7.5–12.5)
Platelets: 294 10*3/uL (ref 140–400)
RBC: 5.11 10*6/uL — ABNORMAL HIGH (ref 3.80–5.10)
RDW: 12.6 % (ref 11.0–15.0)
WBC: 3.8 10*3/uL (ref 3.8–10.8)

## 2023-01-19 LAB — LIPID PANEL
Cholesterol: 171 mg/dL (ref <200)
HDL: 49 mg/dL — ABNORMAL LOW (ref >=50)
LDL Cholesterol (Calc): 102 mg/dL (calc) — ABNORMAL HIGH
Non-HDL Cholesterol (Calc): 122 mg/dL (calc) (ref <130)
Total CHOL/HDL Ratio: 3.5 (calc) (ref <5.0)
Triglycerides: 100 mg/dL (ref <150)

## 2023-01-19 LAB — VITAMIN D 25 HYDROXY (VIT D DEFICIENCY, FRACTURES): Vit D, 25-Hydroxy: 40 ng/mL (ref 30–100)

## 2023-11-11 ENCOUNTER — Encounter: Payer: Self-pay | Admitting: Podiatry

## 2023-11-11 ENCOUNTER — Ambulatory Visit (INDEPENDENT_AMBULATORY_CARE_PROVIDER_SITE_OTHER): Payer: Medicare Other | Admitting: Podiatry

## 2023-11-11 VITALS — Ht 63.0 in | Wt 155.0 lb

## 2023-11-11 DIAGNOSIS — M79674 Pain in right toe(s): Secondary | ICD-10-CM

## 2023-11-11 DIAGNOSIS — M79675 Pain in left toe(s): Secondary | ICD-10-CM | POA: Diagnosis not present

## 2023-11-11 DIAGNOSIS — B351 Tinea unguium: Secondary | ICD-10-CM | POA: Diagnosis not present

## 2023-11-11 NOTE — Progress Notes (Signed)
  Subjective:  Patient ID: Briana Obrien, female    DOB: 02/10/1951,  MRN: [id number]  Chief Complaint  Patient presents with   Nail Problem    She is here to establish for nail care. She has noticed the right foot on the great toe has possibly ingrown into the skin and is painful.     73 y.o. female presents with the above complaint. History confirmed with patient. Patient presenting with pain related to dystrophic thickened elongated nails. Patient is unable to trim own nails related to nail dystrophy and/or mobility issues. Patient states she is pre-diabetic.  Objective:  Physical Exam: warm, good capillary refill nail exam onychomycosis of the toenails, onycholysis, and dystrophic nails DP pulses palpable, PT pulses palpable, and protective sensation intact Left Foot:  Pain with palpation of nails due to elongation and dystrophic growth.  Right Foot: Pain with palpation of nails due to elongation and dystrophic growth. Mild incurvation of right hallux nail medially.  Assessment:   1. Pain due to onychomycosis of toenails of both feet      Plan:  Patient was evaluated and treated and all questions answered.   #Onychomycosis with pain  -Nails palliatively debrided as below. -Educated on self-care  Procedure: Nail Debridement Rationale: Pain Type of Debridement: manual, sharp debridement. Instrumentation: Nail nipper, rotary burr. Number of Nails: 10  Return in about 3 months (around 02/09/2024) for Routine Foot Care.         Ethan Saddler, DPM [facility] / [facility]

## 2023-11-22 ENCOUNTER — Other Ambulatory Visit: Payer: Self-pay | Admitting: Internal Medicine

## 2023-11-22 DIAGNOSIS — Z1231 Encounter for screening mammogram for malignant neoplasm of breast: Secondary | ICD-10-CM

## 2024-01-03 ENCOUNTER — Ambulatory Visit: Payer: Medicare Other

## 2024-01-13 ENCOUNTER — Ambulatory Visit
Admission: RE | Admit: 2024-01-13 | Discharge: 2024-01-13 | Disposition: A | Discharge status: Home / Self Care | Source: Ambulatory Visit | Attending: Internal Medicine | Admitting: Internal Medicine

## 2024-01-13 ENCOUNTER — Ambulatory Visit: Payer: Medicare Other

## 2024-01-13 DIAGNOSIS — Z1231 Encounter for screening mammogram for malignant neoplasm of breast: Secondary | ICD-10-CM

## 2024-02-10 ENCOUNTER — Ambulatory Visit (INDEPENDENT_AMBULATORY_CARE_PROVIDER_SITE_OTHER): Payer: Medicare Other | Admitting: Podiatry

## 2024-02-10 ENCOUNTER — Encounter: Payer: Self-pay | Admitting: Podiatry

## 2024-02-10 DIAGNOSIS — M79674 Pain in right toe(s): Secondary | ICD-10-CM | POA: Diagnosis not present

## 2024-02-10 DIAGNOSIS — M79675 Pain in left toe(s): Secondary | ICD-10-CM

## 2024-02-10 DIAGNOSIS — B351 Tinea unguium: Secondary | ICD-10-CM

## 2024-02-10 NOTE — Progress Notes (Signed)
  Subjective:  Patient ID: Briana Obrien, female    DOB: 05-19-1951,  MRN: [id number]  Chief Complaint  Patient presents with   RFC    She is here for a nail trim,     73 y.o. female presents with the above complaint. History confirmed with patient. Patient presenting with pain related to dystrophic thickened elongated nails. Patient is unable to trim own nails related to nail dystrophy and/or mobility issues. Patient states she is pre-diabetic.  Objective:  Physical Exam: warm, good capillary refill nail exam onychomycosis of the toenails, onycholysis, and dystrophic nails DP pulses palpable, PT pulses palpable, and protective sensation intact Left Foot:  Pain with palpation of nails due to elongation and dystrophic growth.  Right Foot: Pain with palpation of nails due to elongation and dystrophic growth. Mild incurvation of right hallux nail medially.  Assessment:   1. Pain due to onychomycosis of toenails of both feet      Plan:  Patient was evaluated and treated and all questions answered.   #Onychomycosis with pain  -Nails palliatively debrided as below. -Educated on self-care  Procedure: Nail Debridement Rationale: Pain Type of Debridement: manual, sharp debridement. Instrumentation: Nail nipper, rotary burr. Number of Nails: 10  Return in about 3 months (around 05/11/2024) for Routine Foot Care.         Bronwen Betters, DPM [facility] / [facility]

## 2024-05-11 ENCOUNTER — Ambulatory Visit: Admitting: Podiatry

## 2024-06-02 ENCOUNTER — Ambulatory Visit: Admitting: Podiatry

## 2024-06-09 ENCOUNTER — Ambulatory Visit: Admitting: Podiatry

## 2024-06-09 ENCOUNTER — Encounter: Payer: Self-pay | Admitting: Podiatry

## 2024-06-09 DIAGNOSIS — B351 Tinea unguium: Secondary | ICD-10-CM

## 2024-06-09 DIAGNOSIS — M79675 Pain in left toe(s): Secondary | ICD-10-CM | POA: Diagnosis not present

## 2024-06-09 DIAGNOSIS — M79674 Pain in right toe(s): Secondary | ICD-10-CM | POA: Diagnosis not present

## 2024-06-09 NOTE — Progress Notes (Signed)
  Subjective:  Patient ID: Briana Obrien, female    DOB: [birth year],  MRN: [id number]  Chief Complaint  Patient presents with   RFC    Pt is here for RFC.  0 pain. No other issues Non Diabetic     73 y.o. female presents with the above complaint. History confirmed with patient. Patient presenting with pain related to dystrophic thickened elongated nails. Patient is unable to trim own nails related to nail dystrophy and/or mobility issues. Patient states she is pre-diabetic.  Objective:  Physical Exam: warm, good capillary refill nail exam onychomycosis of the toenails, onycholysis, and dystrophic nails DP pulses palpable, PT pulses palpable, and protective sensation intact Left Foot:  Pain with palpation of nails due to elongation and dystrophic growth.  Right Foot: Pain with palpation of nails due to elongation and dystrophic growth. Mild incurvation of right hallux nail medially.  Assessment:   1. Pain due to onychomycosis of toenails of both feet      Plan:  Patient was evaluated and treated and all questions answered.   #Onychomycosis with pain  -Nails palliatively debrided as below. -Educated on self-care  Procedure: Nail Debridement Rationale: Pain Type of Debridement: manual, sharp debridement. Instrumentation: Nail nipper, rotary burr. Number of Nails: 10  Return in about 3 months (around 09/09/2024) for Routine Foot Care.         Ethan Saddler, DPM [facility] / [facility]

## 2024-07-24 ENCOUNTER — Ambulatory Visit (HOSPITAL_COMMUNITY)
Admission: EM | Admit: 2024-07-24 | Discharge: 2024-07-24 | Disposition: A | Discharge status: Home / Self Care | Attending: Family Medicine | Admitting: Family Medicine

## 2024-07-24 ENCOUNTER — Ambulatory Visit (INDEPENDENT_AMBULATORY_CARE_PROVIDER_SITE_OTHER)

## 2024-07-24 ENCOUNTER — Other Ambulatory Visit: Payer: Self-pay

## 2024-07-24 ENCOUNTER — Encounter (HOSPITAL_COMMUNITY): Payer: Self-pay | Admitting: Emergency Medicine

## 2024-07-24 DIAGNOSIS — M25531 Pain in right wrist: Secondary | ICD-10-CM

## 2024-07-24 MED ORDER — HYDROCODONE-ACETAMINOPHEN 5-325 MG PO TABS: 1.0000 | ORAL_TABLET | Freq: Four times a day (QID) | ORAL | 0 refills | Status: DC | PRN

## 2024-07-24 MED ORDER — HYDROCODONE-ACETAMINOPHEN 5-325 MG PO TABS
1.0000 | ORAL_TABLET | Freq: Once | ORAL | Status: AC
  Administered 2024-07-24: 1 via ORAL

## 2024-07-24 MED ORDER — HYDROCODONE-ACETAMINOPHEN 5-325 MG PO TABS
ORAL_TABLET | ORAL | Status: AC
  Filled 2024-07-24: qty 1

## 2024-07-24 NOTE — ED Notes (Signed)
 Provided ice pack

## 2024-07-24 NOTE — ED Triage Notes (Addendum)
 Wrist deformity, occurred 2 hours ago.  Patient reports getting tangled in a dogs leash.  Radial pulse 2 +.  Able to move fingers, brisk cap refill.  Says wrist feels better positioned upright

## 2024-07-24 NOTE — ED Notes (Signed)
 Ortho tech called

## 2024-07-24 NOTE — ED Provider Notes (Signed)
 [facility]    CSN: [id number] Arrival date & time: 07/24/24  1816      History   Chief Complaint No chief complaint on file.   HPI [name] is a 73 y.o. female.   HPI Here for pain in her right wrist and distal forearm.  She was going to her car when her neighbors dog had gotten out and in the patient's feet got tangled up in the dog's leash.  She fell onto her right hand.  She is allergic to penicillin and Plavix.  She does not take blood thinners  Last eGFR was normal  No numbness or tingling in left hand or fingers Past Medical History:  Diagnosis Date   Hypercholesteremia    Hypertension     There are no active problems to display for this patient.   Past Surgical History:  Procedure Laterality Date   BREAST CYST INCISION AND DRAINAGE     HIP SURGERY Bilateral     OB History     Gravida  4   Para      Term      Preterm      AB  1   Living  3      SAB      IAB  1   Ectopic      Multiple      Live Births  3            Home Medications    Prior to Admission medications   Medication Sig Start Date End Date Taking? Authorizing Provider  amLODipine (NORVASC) 5 MG tablet Take 5 mg by mouth daily.    [provider]  Ergocalciferol  (VITAMIN D2 PO) Vitamin D2    [provider]  HYDROcodone -acetaminophen  (NORCO/VICODIN) 5-325 MG tablet Take 1 tablet by mouth every 6 (six) hours as needed (pain). 07/24/24   Jannessa Ogden K, MD  losartan (COZAAR) 50 MG tablet Take 50 mg by mouth daily. 01/14/24   [provider]  LUMIGAN 0.01 % SOLN SMARTSIG:1 Drop(s) In Eye(s) Every Evening 01/06/24   [provider]  risperiDONE (RISPERDAL) 3 MG tablet Take 3 mg by mouth at bedtime.    [provider]  simvastatin (ZOCOR) 20 MG tablet Take 20 mg by mouth daily.    [provider]  simvastatin (ZOCOR) 20 MG tablet simvastatin 20 mg tablet  Take 1 tablet every day by oral route.     [provider]    Family History Family History  Problem Relation Age of Onset   Breast cancer Sister     Social History Social History   Tobacco Use   Smoking status: Every Day    Current packs/day: 4.00    Types: Cigarettes   Smokeless tobacco: Never  Vaping Use   Vaping status: Never Used  Substance Use Topics   Alcohol use: No   Drug use: Never     Allergies   Latex, Penicillins, and Plavix [clopidogrel]   Review of Systems Review of Systems   Physical Exam Triage Vital Signs ED Triage Vitals  Encounter Vitals Group     BP 07/24/24 1937 (!) 200/95     Girls Systolic BP Percentile --      Girls Diastolic BP Percentile --      Boys Systolic BP Percentile --      Boys Diastolic BP Percentile --      Pulse Rate 07/24/24 1937 64     Resp 07/24/24 1937 20  Temp 07/24/24 1937 98.7 F (37.1 C)     Temp Source 07/24/24 1937 Oral     SpO2 07/24/24 1937 100 %     Weight --      Height --      Head Circumference --      Peak Flow --      Pain Score 07/24/24 1933 9     Pain Loc --      Pain Education --      Exclude from Growth Chart --    No data found.  Updated Vital Signs BP (!) 200/95 (BP Location: Left Arm)   Pulse 64   Temp 98.7 F (37.1 C) (Oral)   Resp 20   LMP  (LMP Unknown)   SpO2 100%   Visual Acuity Right Eye Distance:   Left Eye Distance:   Bilateral Distance:    Right Eye Near:   Left Eye Near:    Bilateral Near:     Physical Exam Vitals reviewed.  Constitutional:      General: She is not in acute distress.    Appearance: She is not ill-appearing, toxic-appearing or diaphoretic.  Musculoskeletal:     Comments: There is obvious deformity of the right wrist and distal forearm.  Pulses are normal in the radial and ulnar areas.  Cap refill is normal.  Sensation is intact in the fingertips  Skin:    Coloration: Skin is not pale.  Neurological:     General: No focal deficit present.     Mental Status: She is alert and  oriented to person, place, and time.      UC Treatments / Results  Labs (all labs ordered are listed, but only abnormal results are displayed) Labs Reviewed - No data to display  EKG   Radiology DG Wrist Complete Right Result Date: 07/24/2024 EXAM: 3 or more VIEW(S) XRAY OF THE RIGHT WRIST 07/24/2024 07:44:12 PM COMPARISON: None available. CLINICAL HISTORY: Right wrist and forearm pain and deformity after fell when tripped on dog leash. Wrist deformity, occurred 2 hours ago. Patient reports getting tangled in a dogs leash. Pt unable to make fist with hand or rotate hand for navicular view. Best images obtainable due to pt's pain. FINDINGS: BONES AND JOINTS: Comminuted impacted intra-articular distal radius fracture with apex volar angulation. Fracture involves the articular surface. Impaction and dorsal angulation of the distal fracture fragment by approximately 35 degrees. Resultant marked dorsal tilt of the distal radial articular surface. Resulted in ulnar positive variance. Fracture plane likely extends into the distal radioulnar joint. Ulnar styloid fracture. SOFT TISSUES: Soft tissue swelling. IMPRESSION: 1. Comminuted impacted intra-articular distal radius fracture with apex volar angulation, dorsal angulation of the distal fracture fragment by approximately 35 degrees, and marked dorsal tilt of the distal radial articular surface, resulting in ulnar positive variance. The fracture likely extends into the distal radioulnar joint. 2. Ulnar styloid fracture. 3. Soft tissue swelling. Electronically signed by: Dorethia Molt MD 07/24/2024 08:13 PM EDT RP Workstation: [id number]    Procedures Procedures (including critical care time)  Medications Ordered in UC Medications  HYDROcodone -acetaminophen  (NORCO/VICODIN) 5-325 MG per tablet 1 tablet (1 tablet Oral Given 07/24/24 1958)    Initial Impression / Assessment and Plan / UC Course  I have reviewed the triage vital signs and the nursing  notes.  Pertinent labs & imaging results that were available during my care of the patient were reviewed by me and considered in my medical decision making (see chart for details).  Case discussed with Dr. Delene who is on-call for hand.  He recommended trying to tug on the hand to try to reduce some of the angulation right before the splint was applied here in the clinic.  This was done as the splint was being applied.  Not much reduction was actually achieved.  Hydrocodone  was given here and prescription for that is sent to the pharmacy.  She will call for an appointment with the hand specialist this week. Final Clinical Impressions(s) / UC Diagnoses   Final diagnoses:  Right wrist pain     Discharge Instructions      The radius and ulna are both broken in your wrist.  Hydrocodone  5 mg--1 tablet every 6 hours as needed for pain.  This is best taken with food.  It can cause sleepiness or dizziness We gave you 1 dose of this medication here in the clinic.  This pain medication can also cause constipation.  Drink plenty of fluids and you may need to take some stool softeners.  Please see the hand specialist this week.    ED Prescriptions     Medication Sig Dispense Auth. Provider   HYDROcodone -acetaminophen  (NORCO/VICODIN) 5-325 MG tablet Take 1 tablet by mouth every 6 (six) hours as needed (pain). 12 tablet Kwinton Maahs K, MD      I have reviewed the PDMP during this encounter.   Vonna Sharlet POUR, MD 07/24/24 2052

## 2024-07-24 NOTE — Discharge Instructions (Signed)
 The radius and ulna are both broken in your wrist.  Hydrocodone  5 mg--1 tablet every 6 hours as needed for pain.  This is best taken with food.  It can cause sleepiness or dizziness We gave you 1 dose of this medication here in the clinic.  This pain medication can also cause constipation.  Drink plenty of fluids and you may need to take some stool softeners.  Please see the hand specialist this week.

## 2024-07-24 NOTE — Progress Notes (Signed)
 Orthopedic Tech Progress Note Patient Details:  Briana Obrien 04/26/1951 [id number]  Ortho Devices Type of Ortho Device: Arm sling, Sugartong splint Ortho Device/Splint Location: rue Ortho Device/Splint Interventions: Ordered, Application, Adjustment   Post Interventions Patient Tolerated: Well Instructions Provided: Care of device, Adjustment of device  Chandra Dorn PARAS 07/24/2024, 8:37 PM

## 2024-09-08 ENCOUNTER — Encounter: Payer: Self-pay | Admitting: Podiatry

## 2024-09-08 ENCOUNTER — Ambulatory Visit (INDEPENDENT_AMBULATORY_CARE_PROVIDER_SITE_OTHER): Admitting: Podiatry

## 2024-09-08 DIAGNOSIS — M79675 Pain in left toe(s): Secondary | ICD-10-CM

## 2024-09-08 DIAGNOSIS — B351 Tinea unguium: Secondary | ICD-10-CM

## 2024-09-08 DIAGNOSIS — M79674 Pain in right toe(s): Secondary | ICD-10-CM | POA: Diagnosis not present

## 2024-09-08 NOTE — Progress Notes (Signed)
  Subjective:  Patient ID: Makenize Messman, female    DOB: September 27, 1951,  MRN: [id number]  Chief Complaint  Patient presents with   RFC     RFC Non diabetic toenail trim. 0 pain.    73 y.o. female presents with the above complaint. History confirmed with patient. Patient presenting with pain related to dystrophic thickened elongated nails. Patient is unable to trim own nails related to nail dystrophy and/or mobility issues. Patient states she is pre-diabetic.  Objective:  Physical Exam: warm, good capillary refill nail exam onychomycosis of the toenails, onycholysis, and dystrophic nails DP pulses palpable, PT pulses palpable, and protective sensation intact Left Foot:  Pain with palpation of nails due to elongation and dystrophic growth.  Right Foot: Pain with palpation of nails due to elongation and dystrophic growth. Mild incurvation of right hallux nail medially.  Assessment:   1. Pain due to onychomycosis of toenails of both feet       Plan:  Patient was evaluated and treated and all questions answered.   #Onychomycosis with pain  -Nails palliatively debrided as below. -Educated on self-care -Right hallux slantback nail trim  Procedure: Nail Debridement Rationale: Pain Type of Debridement: manual, sharp debridement. Instrumentation: Nail nipper, rotary burr. Number of Nails: 10   Return in about 3 months (around 12/09/2024) for Routine Foot Care.         Ethan Saddler, DPM [facility] / [facility]

## 2024-12-08 ENCOUNTER — Ambulatory Visit (INDEPENDENT_AMBULATORY_CARE_PROVIDER_SITE_OTHER): Admitting: Podiatry

## 2024-12-08 ENCOUNTER — Encounter: Payer: Self-pay | Admitting: Podiatry

## 2024-12-08 DIAGNOSIS — M79674 Pain in right toe(s): Secondary | ICD-10-CM | POA: Diagnosis not present

## 2024-12-08 DIAGNOSIS — M79675 Pain in left toe(s): Secondary | ICD-10-CM

## 2024-12-08 DIAGNOSIS — B351 Tinea unguium: Secondary | ICD-10-CM | POA: Diagnosis not present

## 2024-12-08 NOTE — Progress Notes (Signed)
"  °  Subjective:  Patient ID: Briana Obrien, female    DOB: 05/07/1951,  MRN: [id number]  Chief Complaint  Patient presents with   RFC    RFC nail trim no calluses. Not diabetc.     74 y.o. female presents with the above complaint. History confirmed with patient. Patient presenting with pain related to dystrophic thickened elongated nails. Patient is unable to trim own nails related to nail dystrophy and/or mobility issues. Patient states she is pre-diabetic.  Objective:  Physical Exam: warm, good capillary refill nail exam onychomycosis of the toenails, onycholysis, and dystrophic nails DP pulses palpable, PT pulses palpable, and protective sensation intact Left Foot:  Pain with palpation of nails due to elongation and dystrophic growth.  Right Foot: Pain with palpation of nails due to elongation and dystrophic growth. Mild incurvation of right hallux nail medially.  Assessment:   1. Pain due to onychomycosis of toenails of both feet       Plan:  Patient was evaluated and treated and all questions answered.   #Onychomycosis with pain  -Nails palliatively debrided as below. -Educated on self-care  Procedure: Nail Debridement Rationale: Pain Type of Debridement: manual, sharp debridement. Instrumentation: Nail nipper, rotary burr. Number of Nails: 10    Return in about 3 months (around 03/08/2025) for Routine Foot Care.         Ethan Saddler, DPM [facility] / [facility]                    "

## 2025-03-09 ENCOUNTER — Ambulatory Visit: Admitting: Podiatry
